# Patient Record
Sex: Female | Born: 1985 | Race: White | Hispanic: No | Marital: Single | Smoking: Never smoker
Health system: Southern US, Community
[De-identification: ages and names within clinical notes are randomized; demographics above are authoritative.]

## PROBLEM LIST (undated history)

## (undated) DIAGNOSIS — E785 Hyperlipidemia, unspecified: Secondary | ICD-10-CM

## (undated) DIAGNOSIS — E119 Type 2 diabetes mellitus without complications: Secondary | ICD-10-CM

## (undated) DIAGNOSIS — Z9889 Other specified postprocedural states: Secondary | ICD-10-CM

## (undated) DIAGNOSIS — H9192 Unspecified hearing loss, left ear: Secondary | ICD-10-CM

## (undated) DIAGNOSIS — K760 Fatty (change of) liver, not elsewhere classified: Secondary | ICD-10-CM

## (undated) DIAGNOSIS — K5792 Diverticulitis of intestine, part unspecified, without perforation or abscess without bleeding: Secondary | ICD-10-CM

## (undated) DIAGNOSIS — G4730 Sleep apnea, unspecified: Secondary | ICD-10-CM

## (undated) DIAGNOSIS — K8020 Calculus of gallbladder without cholecystitis without obstruction: Secondary | ICD-10-CM

## (undated) DIAGNOSIS — Z87442 Personal history of urinary calculi: Secondary | ICD-10-CM

## (undated) HISTORY — DX: Diverticulitis of intestine, part unspecified, without perforation or abscess without bleeding: K57.92

## (undated) HISTORY — DX: Unspecified hearing loss, left ear: H91.92

## (undated) HISTORY — DX: Type 2 diabetes mellitus without complications: E11.9

## (undated) HISTORY — PX: OTHER SURGICAL HISTORY: SHX169

## (undated) HISTORY — DX: Hyperlipidemia, unspecified: E78.5

---

## 2010-01-23 ENCOUNTER — Emergency Department (HOSPITAL_COMMUNITY): Admission: EM | Admit: 2010-01-23 | Discharge: 2010-01-23 | Payer: Self-pay | Admitting: Emergency Medicine

## 2010-05-12 LAB — CBC
HCT: 44.8 % (ref 36.0–46.0)
Hemoglobin: 15.2 g/dL — ABNORMAL HIGH (ref 12.0–15.0)
MCH: 30.4 pg (ref 26.0–34.0)
MCHC: 33.9 g/dL (ref 30.0–36.0)
MCV: 89.6 fL (ref 78.0–100.0)
Platelets: 240 10*3/uL (ref 150–400)
RBC: 5 MIL/uL (ref 3.87–5.11)
RDW: 13.1 % (ref 11.5–15.5)
WBC: 17.7 10*3/uL — ABNORMAL HIGH (ref 4.0–10.5)

## 2010-05-12 LAB — POCT I-STAT, CHEM 8
BUN: 11 mg/dL (ref 6–23)
Calcium, Ion: 1.11 mmol/L — ABNORMAL LOW (ref 1.12–1.32)
Chloride: 109 mEq/L (ref 96–112)
Creatinine, Ser: 0.7 mg/dL (ref 0.4–1.2)
Glucose, Bld: 148 mg/dL — ABNORMAL HIGH (ref 70–99)
HCT: 47 % — ABNORMAL HIGH (ref 36.0–46.0)
Hemoglobin: 16 g/dL — ABNORMAL HIGH (ref 12.0–15.0)
Potassium: 3.9 mEq/L (ref 3.5–5.1)
Sodium: 139 mEq/L (ref 135–145)
TCO2: 21 mmol/L (ref 0–100)

## 2010-05-12 LAB — COMPREHENSIVE METABOLIC PANEL
ALT: 38 U/L — ABNORMAL HIGH (ref 0–35)
AST: 48 U/L — ABNORMAL HIGH (ref 0–37)
Albumin: 3.8 g/dL (ref 3.5–5.2)
Alkaline Phosphatase: 49 U/L (ref 39–117)
BUN: 10 mg/dL (ref 6–23)
CO2: 21 mEq/L (ref 19–32)
Calcium: 9.1 mg/dL (ref 8.4–10.5)
Chloride: 111 mEq/L (ref 96–112)
Creatinine, Ser: 0.69 mg/dL (ref 0.4–1.2)
GFR calc Af Amer: >60 mL/min (ref >60)
GFR calc non Af Amer: >60 mL/min (ref >60)
Glucose, Bld: 152 mg/dL — ABNORMAL HIGH (ref 70–99)
Potassium: 3.6 mEq/L (ref 3.5–5.1)
Sodium: 138 mEq/L (ref 135–145)
Total Bilirubin: 0.3 mg/dL (ref 0.3–1.2)
Total Protein: 6.6 g/dL (ref 6.0–8.3)

## 2010-05-12 LAB — LACTIC ACID, PLASMA: Lactic Acid, Venous: 2.3 mmol/L — ABNORMAL HIGH (ref 0.5–2.2)

## 2010-05-12 LAB — PROTIME-INR
INR: 0.88 (ref 0.00–1.49)
Prothrombin Time: 12.1 seconds (ref 11.6–15.2)

## 2011-10-08 DIAGNOSIS — H7120 Cholesteatoma of mastoid, unspecified ear: Secondary | ICD-10-CM | POA: Insufficient documentation

## 2011-10-08 DIAGNOSIS — H902 Conductive hearing loss, unspecified: Secondary | ICD-10-CM | POA: Insufficient documentation

## 2011-10-08 DIAGNOSIS — G4733 Obstructive sleep apnea (adult) (pediatric): Secondary | ICD-10-CM | POA: Insufficient documentation

## 2011-10-08 DIAGNOSIS — R069 Unspecified abnormalities of breathing: Secondary | ICD-10-CM | POA: Insufficient documentation

## 2012-10-19 ENCOUNTER — Encounter (INDEPENDENT_AMBULATORY_CARE_PROVIDER_SITE_OTHER): Payer: Medicaid Other | Admitting: Obstetrics & Gynecology

## 2012-10-19 ENCOUNTER — Encounter: Payer: Self-pay | Admitting: Obstetrics & Gynecology

## 2012-10-20 ENCOUNTER — Encounter: Payer: Self-pay | Admitting: Obstetrics & Gynecology

## 2012-10-20 NOTE — Progress Notes (Signed)
This encounter was created in error - please disregard.

## 2012-10-24 ENCOUNTER — Other Ambulatory Visit (HOSPITAL_COMMUNITY)
Admission: RE | Admit: 2012-10-24 | Discharge: 2012-10-24 | Disposition: A | Discharge status: Home / Self Care | Payer: Medicaid Other | Source: Ambulatory Visit | Attending: Obstetrics & Gynecology | Admitting: Obstetrics & Gynecology

## 2012-10-24 ENCOUNTER — Other Ambulatory Visit: Payer: Self-pay | Admitting: Obstetrics & Gynecology

## 2012-10-24 ENCOUNTER — Ambulatory Visit (INDEPENDENT_AMBULATORY_CARE_PROVIDER_SITE_OTHER): Payer: Medicaid Other | Admitting: Obstetrics & Gynecology

## 2012-10-24 ENCOUNTER — Encounter: Payer: Self-pay | Admitting: Obstetrics & Gynecology

## 2012-10-24 ENCOUNTER — Other Ambulatory Visit (INDEPENDENT_AMBULATORY_CARE_PROVIDER_SITE_OTHER): Payer: Medicaid Other

## 2012-10-24 VITALS — BP 120/80 | Wt 307.0 lb

## 2012-10-24 DIAGNOSIS — D279 Benign neoplasm of unspecified ovary: Secondary | ICD-10-CM

## 2012-10-24 DIAGNOSIS — Z01419 Encounter for gynecological examination (general) (routine) without abnormal findings: Secondary | ICD-10-CM | POA: Insufficient documentation

## 2012-10-24 DIAGNOSIS — Z113 Encounter for screening for infections with a predominantly sexual mode of transmission: Secondary | ICD-10-CM | POA: Insufficient documentation

## 2012-10-24 DIAGNOSIS — N949 Unspecified condition associated with female genital organs and menstrual cycle: Secondary | ICD-10-CM

## 2012-10-24 DIAGNOSIS — D270 Benign neoplasm of right ovary: Secondary | ICD-10-CM | POA: Insufficient documentation

## 2012-10-24 DIAGNOSIS — N870 Mild cervical dysplasia: Secondary | ICD-10-CM

## 2012-10-24 DIAGNOSIS — N83209 Unspecified ovarian cyst, unspecified side: Secondary | ICD-10-CM

## 2012-10-24 DIAGNOSIS — E282 Polycystic ovarian syndrome: Secondary | ICD-10-CM | POA: Insufficient documentation

## 2012-10-24 NOTE — Progress Notes (Signed)
Patient ID: Tiffany Ward, female   DOB: February 07, 1986, 27 y.o.   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

## 2012-10-24 NOTE — Addendum Note (Signed)
Addended by: Criss Alvine on: 10/24/2012 12:08 PM   Modules accepted: Orders

## 2012-11-08 ENCOUNTER — Telehealth: Payer: Self-pay | Admitting: Adult Health

## 2012-11-08 NOTE — Telephone Encounter (Signed)
Pt aware of normal pap smear.

## 2013-08-07 ENCOUNTER — Ambulatory Visit: Payer: Medicaid Other | Admitting: Obstetrics & Gynecology

## 2013-08-15 ENCOUNTER — Telehealth: Payer: Self-pay | Admitting: Obstetrics and Gynecology

## 2013-08-15 ENCOUNTER — Ambulatory Visit (INDEPENDENT_AMBULATORY_CARE_PROVIDER_SITE_OTHER): Payer: Medicaid Other | Admitting: Obstetrics and Gynecology

## 2013-08-15 ENCOUNTER — Encounter: Payer: Self-pay | Admitting: Obstetrics and Gynecology

## 2013-08-15 VITALS — BP 120/78 | Ht 69.0 in | Wt 293.0 lb

## 2013-08-15 DIAGNOSIS — D270 Benign neoplasm of right ovary: Secondary | ICD-10-CM

## 2013-08-15 DIAGNOSIS — D279 Benign neoplasm of unspecified ovary: Secondary | ICD-10-CM

## 2013-08-15 NOTE — Progress Notes (Signed)
This chart was scribed by Ludger Nutting, Medical Scribe, for Dr. Mallory Shirk on 08/15/13 at 9:36 AM. This chart was reviewed by Dr. Mallory Shirk for accuracy.   [facility] Visit  Patient name: Tiffany Ward MRN [id number]  Date of birth: 05-Oct-1985  CC & HPI:  Tiffany Ward is a 28 y.o. female presenting today for follow up after ED visit at [facility] about 1 month ago. Patient states she had diverticulitis and had a CT scan which showed a cyst pressing on her bladder. She denies any pain unless she is lifting something heavy.   ROS:  Negative  Pertinent History Reviewed:   Reviewed: Significant for  Medical                                   Surgical Hx:   Medications: Reviewed & Updated - see associated section Social History: Reviewed -  reports that she has never smoked. She has never used smokeless tobacco.  Objective Findings:  Vitals: Blood pressure 120/78, height 5\' 9"  (1.753 m), weight 293 lb (132.904 kg).  Physical Examination: Pelvic - Exam normal with variation  VULVA: normal appearing vulva with no masses, tenderness or lesions,  VAGINA: normal appearing vagina with normal color and discharge, no lesions,  CERVIX: normal appearing cervix without discharge or lesions,  UTERUS: uterus is normal size, shape, consistency and nontender, mobile, anterior ADNEXA: normal adnexa in size, nontender and no masses, exam limited by body habitus   See u/s report from 2014 and CT [facility] in 2011. Has a large dermoid  Assessment & Plan:   A: 1. Diverticulitis resolved 2. Alleged ovarian cyst by history  Review of CT from 2011 showed an 8 cm right ovarian dermoid.  P: 1. Review CT results form [place] 2. Follow up in 4 weeks for review of CT results .  Discussed Old ct and u/s by phone after visit: pt choses to follow cyst for now, aware of the rare risk of torsion. Will f/u prn

## 2013-08-24 NOTE — Telephone Encounter (Signed)
CT REVIEWED WITH PT, WHO CHOSES AFTER INFORMED DISCUSSION, TO POSTPONE REMOVAL OF DERMOID AT THIS TIME.

## 2013-09-12 ENCOUNTER — Ambulatory Visit (INDEPENDENT_AMBULATORY_CARE_PROVIDER_SITE_OTHER): Payer: Medicaid Other | Admitting: Obstetrics and Gynecology

## 2013-09-12 ENCOUNTER — Encounter: Payer: Self-pay | Admitting: Obstetrics and Gynecology

## 2013-09-12 VITALS — BP 120/88 | Ht 69.0 in | Wt 285.0 lb

## 2013-09-12 DIAGNOSIS — D270 Benign neoplasm of right ovary: Secondary | ICD-10-CM

## 2013-09-12 DIAGNOSIS — D279 Benign neoplasm of unspecified ovary: Secondary | ICD-10-CM

## 2013-09-12 NOTE — Patient Instructions (Signed)
Ovarian Cyst An ovarian cyst is a fluid-filled sac that forms on an ovary. The ovaries are small organs that produce eggs in women. Various types of cysts can form on the ovaries. Most are not cancerous. Many do not cause problems, and they often go away on their own. Some may cause symptoms and require treatment. Common types of ovarian cysts include:  Functional cysts--These cysts may occur every month during the menstrual cycle. This is normal. The cysts usually go away with the next menstrual cycle if the woman does not get pregnant. Usually, there are no symptoms with a functional cyst.  Endometrioma cysts--These cysts form from the tissue that lines the uterus. They are also called "chocolate cysts" because they become filled with blood that turns brown. This type of cyst can cause pain in the lower abdomen during intercourse and with your menstrual period.  Cystadenoma cysts--This type develops from the cells on the outside of the ovary. These cysts can get very big and cause lower abdomen pain and pain with intercourse. This type of cyst can twist on itself, cut off its blood supply, and cause severe pain. It can also easily rupture and cause a lot of pain.  Dermoid cysts--This type of cyst is sometimes found in both ovaries. These cysts may contain different kinds of body tissue, such as skin, teeth, hair, or cartilage. They usually do not cause symptoms unless they get very big.  Theca lutein cysts--These cysts occur when too much of a certain hormone (human chorionic gonadotropin) is produced and overstimulates the ovaries to produce an egg. This is most common after procedures used to assist with the conception of a baby (in vitro fertilization). CAUSES   Fertility drugs can cause a condition in which multiple large cysts are formed on the ovaries. This is called ovarian hyperstimulation syndrome.  A condition called polycystic ovary syndrome can cause hormonal imbalances that can lead to  nonfunctional ovarian cysts. SIGNS AND SYMPTOMS  Many ovarian cysts do not cause symptoms. If symptoms are present, they may include:  Pelvic pain or pressure.  Pain in the lower abdomen.  Pain during sexual intercourse.  Increasing girth (swelling) of the abdomen.  Abnormal menstrual periods.  Increasing pain with menstrual periods.  Stopping having menstrual periods without being pregnant. DIAGNOSIS  These cysts are commonly found during a routine or annual pelvic exam. Tests may be ordered to find out more about the cyst. These tests may include:  Ultrasound.  X-ray of the pelvis.  CT scan.  MRI.  Blood tests. TREATMENT  Many ovarian cysts go away on their own without treatment. Your health care provider may want to check your cyst regularly for 2-3 months to see if it changes. For women in menopause, it is particularly important to monitor a cyst closely because of the higher rate of ovarian cancer in menopausal women. When treatment is needed, it may include any of the following:  A procedure to drain the cyst (aspiration). This may be done using a long needle and ultrasound. It can also be done through a laparoscopic procedure. This involves using a thin, lighted tube with a tiny camera on the end (laparoscope) inserted through a small incision.  Surgery to remove the whole cyst. This may be done using laparoscopic surgery or an open surgery involving a larger incision in the lower abdomen.  Hormone treatment or birth control pills. These methods are sometimes used to help dissolve a cyst. HOME CARE INSTRUCTIONS   Only take over-the-counter   or prescription medicines as directed by your health care provider.  Follow up with your health care provider as directed.  Get regular pelvic exams and Pap tests. SEEK MEDICAL CARE IF:   Your periods are late, irregular, or painful, or they stop.  Your pelvic pain or abdominal pain does not go away.  Your abdomen becomes  larger or swollen.  You have pressure on your bladder or trouble emptying your bladder completely.  You have pain during sexual intercourse.  You have feelings of fullness, pressure, or discomfort in your stomach.  You lose weight for no apparent reason.  You feel generally ill.  You become constipated.  You lose your appetite.  You develop acne.  You have an increase in body and facial hair.  You are gaining weight, without changing your exercise and eating habits.  You think you are pregnant. SEEK IMMEDIATE MEDICAL CARE IF:   You have increasing abdominal pain.  You feel sick to your stomach (nauseous), and you throw up (vomit).  You develop a fever that comes on suddenly.  You have abdominal pain during a bowel movement.  Your menstrual periods become heavier than usual. MAKE SURE YOU:  Understand these instructions.  Will watch your condition.  Will get help right away if you are not doing well or get worse. Document Released: 02/15/2005 Document Revised: 02/20/2013 Document Reviewed: 10/23/2012 [employer] Patient Information 2015 [employer]. This information is not intended to replace advice given to you by your health care provider. Make sure you discuss any questions you have with your health care provider.  

## 2013-09-12 NOTE — Progress Notes (Signed)
This patient's chart was scribed for Dr. Jonnie Kind by Neta Ehlers, Medical Scribe on 09/12/13 at 9:31 am.   Patient ID: Tiffany Ward, female   DOB: [birth year], 28 y.o.   MRN: [id number]   [name] Visit  Patient name: Tiffany Ward MRN [id number]  Date of birth: 1986-01-31  CC & HPI:  Mack Thurmon is a 28 y.o. female presenting today for f/u. Pt here today for follow up visit. Pt states that she thought Dr.Arnav Cregg was going to do an [place] but there is nothing in his notes about this. Pt does want to discuss taking the cyst out, sooner rather than later.  See ultrasound with dermoid cyst on the right ovary; the cyst measures 10 X 7.2 X 5.9 cm.   Pt' s last pap was October 2014.  She has had one pregnancy; her daughter is 33 years old.  ROS:  Denies abdominal pain.  No other complaints   Pertinent History Reviewed:   Reviewed: Significant for  Medical: Dermoid cyst; PCOS                                    Surgical Hx:   Caesarian section  Medications: Reviewed & Updated - see associated section Social History: Reviewed -  reports that she has never smoked. She has never used smokeless tobacco.  Objective Findings:  Vitals: Blood pressure 120/88, height 5\' 9"  (1.753 m), weight 285 lb (129.275 kg).  Physical Examination: General appearance - alert, well appearing, and in no distress Mental status - alert, oriented to person, place, and time, normal mood, behavior, speech, dress, motor activity, and thought processes Physical Examination: Chest - clear to auscultation, no wheezes, rales or rhonchi, symmetric air entry Physical Examination: Eyes - pupils equal and reactive, extraocular eye movements intact Ears - bilateral TM's and external ear canals normal Nose - normal and patent, no erythema, discharge or polyps Mouth - mucous membranes moist, pharynx normal without lesions Chest - clear to auscultation, no wheezes, rales or rhonchi, symmetric air entry Heart -  normal rate, regular rhythm, normal S1, S2, no murmurs, rubs, clicks or gallops Abdomen - soft, nontender, nondistended, no masses or organomegaly; well-healing surgical scar; obese with large panus  Pelvic - normal external genitalia limited by obesity; VULVA: normal, VAGINA:normal appearing vagina with normal color and discharge, no lesions, CERVIX: normal appearing cervix without discharge or lesions}, UTERUS: well supported , ADNEXA: cannot feel the mass   Chaperone present for pelvic exam.    Assessment & Plan:   A: 1. Dermoid cyst on right ovary   P: 1. Discussed probable laparoscopic surgical removal of dermoid cyst; will attempt to preserve the right ovary and right fallopian tube  2.  Discussed scheduling surgery in August  3. Refer pt to [place]

## 2013-09-20 ENCOUNTER — Encounter (HOSPITAL_COMMUNITY): Payer: Self-pay | Admitting: Pharmacy Technician

## 2013-09-28 ENCOUNTER — Other Ambulatory Visit: Payer: Self-pay | Admitting: Obstetrics and Gynecology

## 2013-09-28 NOTE — H&P (Signed)
  Patient ID: Tiffany Ward, female DOB: Jun 19, 1985, 28 y.o. MRN: [id number]  [facility] Visit   Patient name: Tiffany Ward MRN [id number] Date of birth: Nov 15, 1985  CC & HPI:   Tiffany Ward is a 28 y.o. female presenting today for f/u.  Pt here today for follow up visit. Pt states that she thought Dr.Mikiah Durall was going to do an [place] but there is nothing in his notes about this. Pt does want to discuss taking the cyst out, sooner rather than later.  See ultrasound with dermoid cyst on the right ovary; the cyst measures 10 X 7.2 X 5.9 cm.  Pt' s last pap was October 2014.  She has had one pregnancy; her daughter is 32 years old.  ROS:   Denies abdominal pain.  No other complaints  Pertinent History Reviewed:   Reviewed: Significant for  Medical: Dermoid cyst; PCOS  Surgical Hx: Caesarian section  Medications: Reviewed & Updated - see associated section  Social History: Reviewed - reports that she has never smoked. She has never used smokeless tobacco.  Objective Findings:   Vitals: Blood pressure 120/88, height 5\' 9"  (1.753 m), weight 285 lb (129.275 kg).  Physical Examination: General appearance - alert, well appearing, and in no distress  Mental status - alert, oriented to person, place, and time, normal mood, behavior, speech, dress, motor activity, and thought processes  Physical Examination: Chest - clear to auscultation, no wheezes, rales or rhonchi, symmetric air entry  Physical Examination: Eyes - pupils equal and reactive, extraocular eye movements intact  Ears - bilateral TM's and external ear canals normal  Nose - normal and patent, no erythema, discharge or polyps  Mouth - mucous membranes moist, pharynx normal without lesions  Chest - clear to auscultation, no wheezes, rales or rhonchi, symmetric air entry  Heart - normal rate, regular rhythm, normal S1, S2, no murmurs, rubs, clicks or gallops  Abdomen - soft, nontender, nondistended, no masses or organomegaly;  well-healing surgical scar; obese with large panus  Pelvic - normal external genitalia limited by obesity; VULVA: normal, VAGINA:normal appearing vagina with normal color and discharge, no lesions, CERVIX: normal appearing cervix without discharge or lesions}, UTERUS: well supported , ADNEXA: cannot feel the mass  Chaperone present for pelvic exam.  Assessment & Plan:   A:  1. 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 Discussed probable laparoscopic surgical removal of dermoid cyst; will attempt to preserve the right ovary and right fallopian tube  2. Discussed scheduling surgery in August  3. Refer pt to [place]

## 2013-10-02 NOTE — Patient Instructions (Signed)
Tiffany Ward  10/02/2013   Your procedure is scheduled on:   10/09/2013  Report to [facility] at  23  AM.  Call this number if you have problems the morning of surgery: [phone]   Remember:   Do not eat food or drink liquids after midnight.   Take these medicines the morning of surgery with A SIP OF WATER: none   Do not wear jewelry, make-up or nail polish.  Do not wear lotions, powders, or perfumes.   Do not shave 48 hours prior to surgery. Men may shave face and neck.  Do not bring valuables to the hospital.  [facility] is not responsible  for any belongings or valuables.               Contacts, dentures or bridgework may not be worn into surgery.  Leave suitcase in the car. After surgery it may be brought to your room.  For patients admitted to the hospital, discharge time is determined by your treatment team.               Patients discharged the day of surgery will not be allowed to drive home.  Name and phone number of your driver: family  Special Instructions: Shower using CHG 2 nights before surgery and the night before surgery.  If you shower the day of surgery use CHG.  Use special wash - you have one bottle of CHG for all showers.  You should use approximately 1/3 of the bottle for each shower.   Please read over the following fact sheets that you were given: Pain Booklet, Coughing and Deep Breathing, Surgical Site Infection Prevention, Anesthesia Post-op Instructions and Care and Recovery After Surgery Unilateral Salpingo-Oophorectomy Unilateral salpingo-oophorectomy is the surgical removal of one fallopian tube and ovary. The ovaries are small organs that produce eggs in women. The fallopian tubes transport the egg from the ovary to the womb (uterus). A unilateral salpingo-oophorectomy may be done for various reasons, including:  Infection in the fallopian tube and ovary.  Scar tissue in the fallopian tube and ovary (adhesions).  A cyst or tumor on the  ovary.  A need to remove the fallopian tube and ovary when removing the uterus.  Cancer of the fallopian tube or ovary. The removal of one fallopian tube and ovary will not prevent you from becoming pregnant, put you into menopause, or cause problems with your menstrual periods or sex drive. LET [facility] CARE PROVIDER KNOW ABOUT:  Any allergies you have.  All medicines you are taking, including vitamins, herbs, eye drops, creams, and over-the-counter medicines.  Previous problems you or members of your family have had with the use of anesthetics.  Any blood disorders you have.  Previous surgeries you have had.  Medical conditions you have. RISKS AND COMPLICATIONS  Generally, this is a safe procedure. However, as with any procedure, complications can occur. Possible complications include:  Injury to surrounding organs.  Bleeding.  Infection.  Blood clots in the legs or lungs.  Problems related to anesthesia. BEFORE THE PROCEDURE  Ask your health care provider about changing or stopping your regular medicines. You may need to stop taking certain medicines, such as aspirin or blood thinners, at least 1 week before the surgery.  Do not eat or drink anything for at least 8 hours before the surgery.  If you smoke, do not smoke for at least 2 weeks before the surgery.  Make plans to have someone  drive you home after the procedure or after your hospital stay. Also arrange for someone to help you with activities during recovery. PROCEDURE  You will be given medicine to help you relax before the procedure (sedative). You will then be given medicine to make you sleep through the procedure (general anesthetic). These medicines will be given through an IV access tube that is put into one of your veins.  Once you are asleep, your lower abdomen will be shaved and cleaned. A thin, flexible tube (catheter) will be placed in your bladder.  The surgeon may use a laparoscopic, robotic,  or open technique for this surgery:  In the laparoscopic technique, the surgery is done through two small cuts (incisions) in the abdomen. A thin, lighted tube with a tiny camera on the end (laparoscope) is inserted into one of the incisions. The tools needed for the procedure are put through the other incision.  A robotic technique may be chosen to perform complex surgery in a small space. In the robotic technique, small incisions are made. A camera and surgical instruments are passed through the incisions. Surgical instruments are controlled with the help of a robotic arm.  In the open technique, the surgery is done through one large incision in the abdomen.  Using any of these techniques, the surgeon will remove the fallopian tube and ovary. The blood vessels will be clamped and tied.  The surgeon will then use staples or stitches to close the incision or incisions. AFTER THE PROCEDURE  You will be taken to a recovery area where your progress will be monitored for 1-3 hours. Your blood pressure, pulse, and temperature will be checked often. You will remain in the recovery area until you are stable and waking up.  If the laparoscopic technique was used, you may be allowed to go home after several hours. You may have some shoulder pain. This is normal and usually goes away in a day or two.  If the open technique was used, you will be admitted to the hospital for a couple of days.  You will be given pain medicine as necessary.  The IV tube and catheter will be removed before you are discharged. Document Released: 12/13/2008 Document Revised: 02/20/2013 Document Reviewed: 08/09/2012 [facility] Patient Information 2015 [place], [place]. This information is not intended to replace advice given to you by your health care provider. Make sure you discuss any questions you have with your health care provider. Ovarian Cystectomy Ovarian cystectomy is surgery to remove a fluid-filled sac (cyst) on an  ovary. The ovaries are small organs that produce eggs in women. Various types of cysts can form on the ovaries. Most are not cancerous. Surgery may be done if a cyst is large or is causing symptoms such as pain. It may also be done for a cyst that is or might be cancerous. This surgery can be done using a laparoscopic technique or an open abdominal technique. The laparoscopic technique involves smaller cuts (incisions) and a faster recovery time. The technique used will depend on your age, the type of cyst, and whether the cyst is cancerous. The laparoscopic technique is not used for a cancerous cyst. LET [facility] CARE PROVIDER KNOW ABOUT:   Any allergies you have.  All medicines you are taking, including vitamins, herbs, eye drops, creams, and over-the-counter medicines.  Previous problems you or members of your family have had with the use of anesthetics.  Any blood disorders you have.  Previous surgeries you have had.  Medical conditions you have.  Any chance you might be pregnant. RISKS AND COMPLICATIONS Generally, this is a safe procedure. However, as with any procedure, complications can occur. Possible complications include:  Excessive bleeding.  Infection.  Injury to other organs.  Blood clots.  Becoming incapable of getting pregnant (infertile). BEFORE THE PROCEDURE  Ask your health care provider about changing or stopping any regular medicines. Avoid taking aspirin, ibuprofen, or blood thinners as directed by your health care provider.  Do not eat or drink anything after midnight the night before surgery.  If you smoke, do not smoke for at least 2 weeks before your surgery.  Do not drink alcohol the day before your surgery.  Let your health care provider know if you develop a cold or any infection before your surgery.  Arrange for someone to drive you home after the procedure or after your hospital stay. Also arrange for someone to help you with activities during  recovery. PROCEDURE  Either a laparoscopic technique or an open abdominal technique may be used for this surgery.  Small monitors will be put on your body. They are used to check your heart, blood pressure, and oxygen level.   An IV access tube will be put into one of your veins. Medicine will be able to flow directly into your body through this IV tube.   You might be given a medicine to help you relax (sedative).   You will be given a medicine to make you sleep (general anesthetic). A breathing tube may be placed into your lungs during the procedure. Laparoscopic Technique  Several small cuts (incisions) are made in your abdomen. These are typically about 1 to 2 cm long.   Your abdomen will be filled with carbon dioxide gas so that it expands. This gives the surgeon more room to operate and makes your organs easier to see.   A thin, lighted tube with a tiny camera on the end (laparoscope) is put through one of the small incisions. The camera on the laparoscope sends a picture to a TV screen in the operating room. This gives the surgeon a good view inside your abdomen.   Hollow tubes are put through the other small incisions in your abdomen. The tools needed for the procedure are put through these tubes.  The ovary with the cyst is identified, and the cyst is removed. It is sent to the lab for testing. If it is cancer, both ovaries may need to be removed during a different surgery.  Tools are removed. The incisions are then closed with stitches or skin glue, and dressings may be applied. Open Abdominal Technique  A single large incision is made along your bikini line or in the middle of your lower abdomen.  The ovary with the cyst is identified, and the cyst is removed. It is sent to the lab for testing. If it is cancer, both ovaries may need to be removed during a different surgery.  The incision is then closed with stitches or staples. AFTER THE PROCEDURE   You will wake up  from anesthesia and be taken to a recovery area.  If you had laparoscopic surgery, you may be able to go home the same day, or you may need to stay in the hospital overnight.  If you had open abdominal surgery, you will need to stay in the hospital for a few days.  Your IV access tube and catheter will be removed the first or second day, after you are able  to eat and drink enough.  You may be given medicine to relieve pain or to help you sleep.  You may be given an antibiotic medicine if needed. Document Released: 12/13/2006 Document Revised: 12/06/2012 Document Reviewed: 09/27/2012 [facility] Patient Information 2015 [place], [place]. This information is not intended to replace advice given to you by your health care provider. Make sure you discuss any questions you have with your health care provider. PATIENT INSTRUCTIONS POST-ANESTHESIA  IMMEDIATELY FOLLOWING SURGERY:  Do not drive or operate machinery for the first twenty four hours after surgery.  Do not make any important decisions for twenty four hours after surgery or while taking narcotic pain medications or sedatives.  If you develop intractable nausea and vomiting or a severe headache please notify your doctor immediately.  FOLLOW-UP:  Please make an appointment with your surgeon as instructed. You do not need to follow up with anesthesia unless specifically instructed to do so.  WOUND CARE INSTRUCTIONS (if applicable):  Keep a dry clean dressing on the anesthesia/puncture wound site if there is drainage.  Once the wound has quit draining you may leave it open to air.  Generally you should leave the bandage intact for twenty four hours unless there is drainage.  If the epidural site drains for more than 36-48 hours please call the anesthesia department.  QUESTIONS?:  Please feel free to call your physician or the hospital operator if you have any questions, and they will be happy to assist you.

## 2013-10-03 ENCOUNTER — Encounter (HOSPITAL_COMMUNITY): Payer: Self-pay

## 2013-10-03 ENCOUNTER — Encounter (HOSPITAL_COMMUNITY)
Admission: RE | Admit: 2013-10-03 | Discharge: 2013-10-03 | Disposition: A | Discharge status: Home / Self Care | Payer: Medicaid Other | Source: Ambulatory Visit | Attending: Obstetrics and Gynecology | Admitting: Obstetrics and Gynecology

## 2013-10-03 DIAGNOSIS — Z01818 Encounter for other preprocedural examination: Secondary | ICD-10-CM | POA: Insufficient documentation

## 2013-10-03 DIAGNOSIS — Z01812 Encounter for preprocedural laboratory examination: Secondary | ICD-10-CM | POA: Insufficient documentation

## 2013-10-03 HISTORY — DX: Sleep apnea, unspecified: G47.30

## 2013-10-03 LAB — URINE MICROSCOPIC-ADD ON

## 2013-10-03 LAB — COMPREHENSIVE METABOLIC PANEL
ALT: 23 U/L (ref 0–35)
AST: 20 U/L (ref 0–37)
Albumin: 3.7 g/dL (ref 3.5–5.2)
Alkaline Phosphatase: 55 U/L (ref 39–117)
Anion gap: 10 (ref 5–15)
BUN: 12 mg/dL (ref 6–23)
CO2: 26 mEq/L (ref 19–32)
Calcium: 9.5 mg/dL (ref 8.4–10.5)
Chloride: 105 mEq/L (ref 96–112)
Creatinine, Ser: 0.73 mg/dL (ref 0.50–1.10)
GFR calc Af Amer: >90 mL/min (ref >90)
GFR calc non Af Amer: >90 mL/min (ref >90)
Glucose, Bld: 93 mg/dL (ref 70–99)
Potassium: 4.2 mEq/L (ref 3.7–5.3)
Sodium: 141 mEq/L (ref 137–147)
Total Bilirubin: 0.2 mg/dL — ABNORMAL LOW (ref 0.3–1.2)
Total Protein: 6.9 g/dL (ref 6.0–8.3)

## 2013-10-03 LAB — CBC
HCT: 42.3 % (ref 36.0–46.0)
Hemoglobin: 14.2 g/dL (ref 12.0–15.0)
MCH: 29.7 pg (ref 26.0–34.0)
MCHC: 33.6 g/dL (ref 30.0–36.0)
MCV: 88.5 fL (ref 78.0–100.0)
Platelets: 243 10*3/uL (ref 150–400)
RBC: 4.78 MIL/uL (ref 3.87–5.11)
RDW: 13.2 % (ref 11.5–15.5)
WBC: 8.5 10*3/uL (ref 4.0–10.5)

## 2013-10-03 LAB — URINALYSIS, ROUTINE W REFLEX MICROSCOPIC
Bilirubin Urine: NEGATIVE
Glucose, UA: NEGATIVE mg/dL
Leukocytes, UA: NEGATIVE
Nitrite: NEGATIVE
Specific Gravity, Urine: >1.03 — ABNORMAL HIGH (ref 1.005–1.030)
Urobilinogen, UA: 0.2 mg/dL (ref 0.0–1.0)
pH: 5.5 (ref 5.0–8.0)

## 2013-10-03 LAB — HCG, SERUM, QUALITATIVE: Preg, Serum: NEGATIVE

## 2013-10-03 NOTE — Pre-Procedure Instructions (Signed)
Patient given information to sign up for my chart at home. 

## 2013-10-09 ENCOUNTER — Ambulatory Visit (HOSPITAL_COMMUNITY)
Admission: RE | Admit: 2013-10-09 | Discharge: 2013-10-09 | Disposition: A | Discharge status: Home / Self Care | Payer: Medicaid Other | Source: Ambulatory Visit | Attending: Obstetrics and Gynecology | Admitting: Obstetrics and Gynecology

## 2013-10-09 ENCOUNTER — Ambulatory Visit (HOSPITAL_COMMUNITY): Payer: Medicaid Other | Admitting: Anesthesiology

## 2013-10-09 ENCOUNTER — Encounter (HOSPITAL_COMMUNITY)
Admission: RE | Disposition: A | Discharge status: Home / Self Care | Payer: Self-pay | Source: Ambulatory Visit | Attending: Obstetrics and Gynecology

## 2013-10-09 ENCOUNTER — Encounter (HOSPITAL_COMMUNITY): Payer: Medicaid Other | Admitting: Anesthesiology

## 2013-10-09 DIAGNOSIS — N8300 Follicular cyst of ovary, unspecified side: Secondary | ICD-10-CM

## 2013-10-09 DIAGNOSIS — D270 Benign neoplasm of right ovary: Secondary | ICD-10-CM | POA: Diagnosis present

## 2013-10-09 DIAGNOSIS — D279 Benign neoplasm of unspecified ovary: Secondary | ICD-10-CM | POA: Diagnosis not present

## 2013-10-09 DIAGNOSIS — E282 Polycystic ovarian syndrome: Secondary | ICD-10-CM | POA: Diagnosis not present

## 2013-10-09 HISTORY — PX: LAPAROSCOPIC SALPINGO OOPHERECTOMY: SHX5927

## 2013-10-09 LAB — GLUCOSE, CAPILLARY
Glucose-Capillary: 87 mg/dL (ref 70–99)
Glucose-Capillary: 103 mg/dL — ABNORMAL HIGH (ref 70–99)

## 2013-10-09 SURGERY — SALPINGO-OOPHORECTOMY, LAPAROSCOPIC
Anesthesia: General | Laterality: Right

## 2013-10-09 MED ORDER — NEOSTIGMINE METHYLSULFATE 10 MG/10ML IV SOLN
INTRAVENOUS | Status: DC | PRN
Start: 2013-10-09 — End: 2013-10-09
  Administered 2013-10-09: 1 mg via INTRAVENOUS
  Administered 2013-10-09: 2 mg via INTRAVENOUS
  Administered 2013-10-09: 1 mg via INTRAVENOUS

## 2013-10-09 MED ORDER — FENTANYL CITRATE 0.05 MG/ML IJ SOLN
INTRAMUSCULAR | Status: AC
  Filled 2013-10-09: qty 5

## 2013-10-09 MED ORDER — KETOROLAC TROMETHAMINE 30 MG/ML IJ SOLN
30.0000 mg | Freq: Once | INTRAMUSCULAR | Status: AC
  Administered 2013-10-09: 30 mg via INTRAVENOUS

## 2013-10-09 MED ORDER — MIDAZOLAM HCL 2 MG/2ML IJ SOLN
INTRAMUSCULAR | Status: AC
  Filled 2013-10-09: qty 2

## 2013-10-09 MED ORDER — ROCURONIUM BROMIDE 50 MG/5ML IV SOLN
INTRAVENOUS | Status: AC
  Filled 2013-10-09: qty 1

## 2013-10-09 MED ORDER — LACTATED RINGERS IV SOLN
INTRAVENOUS | Status: DC
  Administered 2013-10-09: 12:00:00 via INTRAVENOUS
  Administered 2013-10-09: 1000 mL via INTRAVENOUS

## 2013-10-09 MED ORDER — DEXAMETHASONE SODIUM PHOSPHATE 4 MG/ML IJ SOLN
4.0000 mg | Freq: Once | INTRAMUSCULAR | Status: AC
  Administered 2013-10-09: 4 mg via INTRAVENOUS

## 2013-10-09 MED ORDER — PROPOFOL 10 MG/ML IV BOLUS
INTRAVENOUS | Status: AC
  Filled 2013-10-09: qty 20

## 2013-10-09 MED ORDER — ONDANSETRON HCL 4 MG/2ML IJ SOLN
4.0000 mg | Freq: Once | INTRAMUSCULAR | Status: AC
  Administered 2013-10-09: 4 mg via INTRAVENOUS

## 2013-10-09 MED ORDER — ONDANSETRON HCL 4 MG/2ML IJ SOLN
4.0000 mg | Freq: Once | INTRAMUSCULAR | Status: AC | PRN
  Administered 2013-10-09: 4 mg via INTRAVENOUS

## 2013-10-09 MED ORDER — GLYCOPYRROLATE 0.2 MG/ML IJ SOLN
0.2000 mg | Freq: Once | INTRAMUSCULAR | Status: AC
  Administered 2013-10-09: 0.2 mg via INTRAVENOUS

## 2013-10-09 MED ORDER — FENTANYL CITRATE 0.05 MG/ML IJ SOLN
INTRAMUSCULAR | Status: DC | PRN
  Administered 2013-10-09 (×5): 50 ug via INTRAVENOUS
  Administered 2013-10-09: 100 ug via INTRAVENOUS
  Administered 2013-10-09: 50 ug via INTRAVENOUS

## 2013-10-09 MED ORDER — LIDOCAINE HCL (PF) 1 % IJ SOLN
INTRAMUSCULAR | Status: AC
  Filled 2013-10-09: qty 5

## 2013-10-09 MED ORDER — BUPIVACAINE HCL (PF) 0.5 % IJ SOLN
INTRAMUSCULAR | Status: DC | PRN
  Administered 2013-10-09: 10 mL

## 2013-10-09 MED ORDER — LIDOCAINE HCL 1 % IJ SOLN
INTRAMUSCULAR | Status: DC | PRN
  Administered 2013-10-09: 50 mg via INTRADERMAL

## 2013-10-09 MED ORDER — GLYCOPYRROLATE 0.2 MG/ML IJ SOLN
INTRAMUSCULAR | Status: DC | PRN
  Administered 2013-10-09: 0.4 mg via INTRAVENOUS

## 2013-10-09 MED ORDER — PROPOFOL 10 MG/ML IV BOLUS
INTRAVENOUS | Status: DC | PRN
  Administered 2013-10-09: 160 mg via INTRAVENOUS

## 2013-10-09 MED ORDER — VASOPRESSIN 20 UNIT/ML IJ SOLN
INTRAMUSCULAR | Status: AC
  Filled 2013-10-09: qty 1

## 2013-10-09 MED ORDER — ONDANSETRON HCL 4 MG/2ML IJ SOLN
INTRAMUSCULAR | Status: AC
  Filled 2013-10-09: qty 2

## 2013-10-09 MED ORDER — KETOROLAC TROMETHAMINE 10 MG PO TABS: 10.0000 mg | ORAL_TABLET | Freq: Four times a day (QID) | ORAL | Status: DC | PRN

## 2013-10-09 MED ORDER — 0.9 % SODIUM CHLORIDE (POUR BTL) OPTIME
TOPICAL | Status: DC | PRN
Start: 2013-10-09 — End: 2013-10-09
  Administered 2013-10-09: 1000 mL

## 2013-10-09 MED ORDER — KETOROLAC TROMETHAMINE 30 MG/ML IJ SOLN
INTRAMUSCULAR | Status: AC
  Filled 2013-10-09: qty 1

## 2013-10-09 MED ORDER — FENTANYL CITRATE 0.05 MG/ML IJ SOLN
25.0000 ug | INTRAMUSCULAR | Status: DC | PRN
  Administered 2013-10-09 (×2): 50 ug via INTRAVENOUS

## 2013-10-09 MED ORDER — FENTANYL CITRATE 0.05 MG/ML IJ SOLN
INTRAMUSCULAR | Status: AC
  Filled 2013-10-09: qty 2

## 2013-10-09 MED ORDER — MIDAZOLAM HCL 2 MG/2ML IJ SOLN
1.0000 mg | INTRAMUSCULAR | Status: DC | PRN
  Administered 2013-10-09 (×2): 2 mg via INTRAVENOUS
  Filled 2013-10-09: qty 2

## 2013-10-09 MED ORDER — SUCCINYLCHOLINE CHLORIDE 20 MG/ML IJ SOLN
INTRAMUSCULAR | Status: DC | PRN
  Administered 2013-10-09: 175 mg via INTRAVENOUS

## 2013-10-09 MED ORDER — BUPIVACAINE HCL (PF) 0.5 % IJ SOLN
INTRAMUSCULAR | Status: AC
  Filled 2013-10-09: qty 30

## 2013-10-09 MED ORDER — DEXAMETHASONE SODIUM PHOSPHATE 4 MG/ML IJ SOLN
INTRAMUSCULAR | Status: AC
  Filled 2013-10-09: qty 1

## 2013-10-09 MED ORDER — GLYCOPYRROLATE 0.2 MG/ML IJ SOLN
INTRAMUSCULAR | Status: AC
  Filled 2013-10-09: qty 2

## 2013-10-09 MED ORDER — ROCURONIUM BROMIDE 100 MG/10ML IV SOLN
INTRAVENOUS | Status: DC | PRN
  Administered 2013-10-09: 10 mg via INTRAVENOUS
  Administered 2013-10-09: 40 mg via INTRAVENOUS
  Administered 2013-10-09 (×2): 10 mg via INTRAVENOUS

## 2013-10-09 MED ORDER — MIDAZOLAM HCL 5 MG/5ML IJ SOLN
INTRAMUSCULAR | Status: DC | PRN
  Administered 2013-10-09: 2 mg via INTRAVENOUS

## 2013-10-09 MED ORDER — OXYCODONE-ACETAMINOPHEN 5-325 MG PO TABS: 1.0000 | ORAL_TABLET | ORAL | Status: DC | PRN

## 2013-10-09 MED ORDER — KETOROLAC TROMETHAMINE 30 MG/ML IM SOLN: 30.0000 mg | Freq: Once | INTRAMUSCULAR | Status: DC

## 2013-10-09 MED ORDER — GLYCOPYRROLATE 0.2 MG/ML IJ SOLN
INTRAMUSCULAR | Status: AC
  Filled 2013-10-09: qty 1

## 2013-10-09 MED ORDER — SUCCINYLCHOLINE CHLORIDE 20 MG/ML IJ SOLN
INTRAMUSCULAR | Status: AC
  Filled 2013-10-09: qty 1

## 2013-10-09 SURGICAL SUPPLY — 54 items
BAG HAMPER (MISCELLANEOUS) ×3 IMPLANT
BANDAGE STRIP 1X3 FLEXIBLE (GAUZE/BANDAGES/DRESSINGS) ×12 IMPLANT
BLADE SURG SZ11 CARB STEEL (BLADE) ×3 IMPLANT
CLOSURE WOUND 1/4 X3 (GAUZE/BANDAGES/DRESSINGS) ×1
CLOTH BEACON ORANGE TIMEOUT ST (SAFETY) ×3 IMPLANT
COVER LIGHT HANDLE STERIS (MISCELLANEOUS) ×6 IMPLANT
DRSG TEGADERM 2-3/8X2-3/4 SM (GAUZE/BANDAGES/DRESSINGS) ×9 IMPLANT
DRSG TEGADERM 4X4.75 (GAUZE/BANDAGES/DRESSINGS) ×3 IMPLANT
DURAPREP 26ML APPLICATOR (WOUND CARE) ×3 IMPLANT
ELECT REM PT RETURN 9FT ADLT (ELECTROSURGICAL) ×3
ELECTRODE REM PT RTRN 9FT ADLT (ELECTROSURGICAL) ×1 IMPLANT
FILTER SMOKE EVAC LAPAROSHD (FILTER) ×3 IMPLANT
FORMALIN 10 PREFIL 480ML (MISCELLANEOUS) ×3 IMPLANT
GAUZE SPONGE 4X4 16PLY XRAY LF (GAUZE/BANDAGES/DRESSINGS) ×3 IMPLANT
GLOVE BIO SURGEON STRL SZ7 (GLOVE) ×9 IMPLANT
GLOVE BIOGEL PI IND STRL 7.5 (GLOVE) ×1 IMPLANT
GLOVE BIOGEL PI IND STRL 9 (GLOVE) ×2 IMPLANT
GLOVE BIOGEL PI INDICATOR 7.5 (GLOVE) ×2
GLOVE BIOGEL PI INDICATOR 9 (GLOVE) ×4
GLOVE ECLIPSE 6.5 STRL STRAW (GLOVE) ×3 IMPLANT
GLOVE ECLIPSE 9.0 STRL (GLOVE) ×3 IMPLANT
GLOVE SURG SS PI 7.5 STRL IVOR (GLOVE) ×3 IMPLANT
GOWN SPEC L3 XXLG W/TWL (GOWN DISPOSABLE) ×3 IMPLANT
GOWN STRL REUS W/TWL LRG LVL3 (GOWN DISPOSABLE) ×9 IMPLANT
INST SET LAPROSCOPIC GYN AP (KITS) ×3 IMPLANT
KIT ROOM TURNOVER AP CYSTO (KITS) ×3 IMPLANT
KIT ROOM TURNOVER APOR (KITS) ×3 IMPLANT
KIT TROCAR LAP GYN (TROCAR) ×3 IMPLANT
MANIFOLD NEPTUNE II (INSTRUMENTS) ×3 IMPLANT
NEEDLE HYPO 25X1 1.5 SAFETY (NEEDLE) ×3 IMPLANT
NEEDLE INSUFFLATION 120MM (ENDOMECHANICALS) ×3 IMPLANT
NS IRRIG 1000ML POUR BTL (IV SOLUTION) ×3 IMPLANT
PACK PERI GYN (CUSTOM PROCEDURE TRAY) ×3 IMPLANT
PAD ARMBOARD 7.5X6 YLW CONV (MISCELLANEOUS) ×3 IMPLANT
POUCH SPECIMEN RETRIEVAL 10MM (ENDOMECHANICALS) ×3 IMPLANT
SCALPEL HARMONIC ACE (MISCELLANEOUS) ×3 IMPLANT
SET BASIN LINEN APH (SET/KITS/TRAYS/PACK) ×3 IMPLANT
SLEEVE ENDOPATH XCEL 5M (ENDOMECHANICALS) ×3 IMPLANT
SOLUTION ANTI FOG 6CC (MISCELLANEOUS) ×3 IMPLANT
STRIP CLOSURE SKIN 1/4X3 (GAUZE/BANDAGES/DRESSINGS) ×2 IMPLANT
SUT VIC AB 4-0 PS2 27 (SUTURE) ×6 IMPLANT
SUT VICRYL 0 UR6 27IN ABS (SUTURE) ×6 IMPLANT
SYR BULB IRRIGATION 50ML (SYRINGE) ×3 IMPLANT
SYRINGE 12CC LL (MISCELLANEOUS) ×3 IMPLANT
SYRINGE 60CC LL (MISCELLANEOUS) ×3 IMPLANT
SYRINGE CONTROL L 12CC (SYRINGE) ×3 IMPLANT
TOWEL OR 17X26 4PK STRL BLUE (TOWEL DISPOSABLE) ×3 IMPLANT
TRAY FOLEY CATH 16FR SILVER (SET/KITS/TRAYS/PACK) ×3 IMPLANT
TROCAR ENDO BLADELESS 11MM (ENDOMECHANICALS) ×3 IMPLANT
TROCAR XCEL NON-BLD 5MMX100MML (ENDOMECHANICALS) ×3 IMPLANT
TROCAR Z-THAD FIOS HNDL 12X100 (TROCAR) ×3 IMPLANT
TUBING INSUFFLATION (TUBING) ×3 IMPLANT
TUBING INSUFFLATION 10FT LAP (TUBING) ×3 IMPLANT
WARMER LAPAROSCOPE (MISCELLANEOUS) ×3 IMPLANT

## 2013-10-09 NOTE — Anesthesia Preprocedure Evaluation (Signed)
Anesthesia Evaluation  Patient identified by MRN, date of birth, ID band Patient awake    Reviewed: Allergy & Precautions, H&P , NPO status , Patient's Chart, lab work & pertinent test results  Airway Mallampati: II TM Distance: >3 FB     Dental  (+) Teeth Intact   Pulmonary sleep apnea ,  breath sounds clear to auscultation        Cardiovascular negative cardio ROS  Rhythm:Regular Rate:Normal     Neuro/Psych    GI/Hepatic negative GI ROS,   Endo/Other  diabetes (no meds), Type 2Morbid obesity  Renal/GU      Musculoskeletal   Abdominal   Peds  Hematology   Anesthesia Other Findings   Reproductive/Obstetrics                           Anesthesia Physical Anesthesia Plan  ASA: II  Anesthesia Plan: General   Post-op Pain Management:    Induction: Intravenous, Rapid sequence and Cricoid pressure planned  Airway Management Planned: Oral ETT  Additional Equipment:   Intra-op Plan:   Post-operative Plan: Extubation in OR  Informed Consent: I have reviewed the patients History and Physical, chart, labs and discussed the procedure including the risks, benefits and alternatives for the proposed anesthesia with the patient or authorized representative who has indicated his/her understanding and acceptance.     Plan Discussed with:   Anesthesia Plan Comments:         Anesthesia Quick Evaluation

## 2013-10-09 NOTE — Transfer of Care (Signed)
Immediate Anesthesia Transfer of Care Note  Patient: Tiffany Ward  Procedure(s) Performed: Procedure(s): LAPAROSCOPIC RIGHT OOPHORECTOMY (Right)  Patient Location: PACU  Anesthesia Type:General  Level of Consciousness: awake and patient cooperative  Airway & Oxygen Therapy: Patient Spontanous Breathing and Patient connected to face mask oxygen  Post-op Assessment: Report given to PACU RN, Post -op Vital signs reviewed and stable and Patient moving all extremities  Post vital signs: Reviewed and stable  Complications: No apparent anesthesia complications

## 2013-10-09 NOTE — Anesthesia Procedure Notes (Signed)
Procedure Name: Intubation Date/Time: 10/09/2013 11:35 AM Performed by: Charmaine Downs Pre-anesthesia Checklist: Emergency Drugs available, Patient identified, Suction available and Patient being monitored Patient Re-evaluated:Patient Re-evaluated prior to inductionOxygen Delivery Method: Circle system utilized Preoxygenation: Pre-oxygenation with 100% oxygen Intubation Type: IV induction, Rapid sequence and Cricoid Pressure applied Ventilation: Mask ventilation without difficulty Laryngoscope Size: Mac and 3 Grade View: Grade I Tube type: Oral Tube size: 7.0 mm Number of attempts: 1 Airway Equipment and Method: Stylet Placement Confirmation: positive ETCO2,  ETT inserted through vocal cords under direct vision and breath sounds checked- equal and bilateral Secured at: 22 cm Tube secured with: Tape Dental Injury: Teeth and Oropharynx as per pre-operative assessment

## 2013-10-09 NOTE — Discharge Instructions (Signed)
You we'll be out of work for 2 weeks, and will be seen in the office prior to returning to work    Diagnostic Laparoscopy Laparoscopy is a surgical procedure. It is used to diagnose and treat diseases inside the belly (abdomen). It is usually a brief, common, and relatively simple procedure. The laparoscopeis a thin, lighted, pencil-sized instrument. It is like a telescope. It is inserted into your abdomen through a small cut (incision). Your caregiver can look at the organs inside your body through this instrument. He or she can see if there is anything abnormal. Laparoscopy can be done either in a hospital or outpatient clinic. You may be given a mild sedative to help you relax before the procedure. Once in the operating room, you will be given a drug to make you sleep (general anesthesia). Laparoscopy usually lasts less than 1 hour. After the procedure, you will be monitored in a recovery area until you are stable and doing well. Once you are home, it will take 2 to 3 days to fully recover. RISKS AND COMPLICATIONS  Laparoscopy has relatively few risks. Your caregiver will discuss the risks with you before the procedure. Some problems that can occur include:  Infection.  Bleeding.  Damage to other organs.  Anesthetic side effects. PROCEDURE Once you receive anesthesia, your surgeon inflates the abdomen with a harmless gas (carbon dioxide). This makes the organs easier to see. The laparoscope is inserted into the abdomen through a small incision. This allows your surgeon to see into the abdomen. Other small instruments are also inserted into the abdomen through other small openings. Many surgeons attach a video camera to the laparoscope to enlarge the view. During a diagnostic laparoscopy, the surgeon may be looking for inflammation, infection, or cancer. Your surgeon may take tissue samples(biopsies). The samples are sent to a specialist in looking at cells and tissue samples (pathologist).  The pathologist examines them under a microscope. Biopsies can help to diagnose or confirm a disease. AFTER THE PROCEDURE   The gas is released from inside the abdomen.  The incisions are closed with stitches (sutures). Because these incisions are small (usually less than 1/2 inch), there is usually minimal discomfort after the procedure. There may be some mild discomfort in the throat. This is from the tube placed in the throat while you were sleeping. You may have some mild abdominal discomfort. There may also be discomfort from the instrument placement incisions in the abdomen.  The recovery time is shortened as long as there are no complications.  You will rest in a recovery room until stable and doing well. As long as there are no complications, you may be allowed to go home. FINDING OUT THE RESULTS OF YOUR TEST Not all test results are available during your visit. If your test results are not back during the visit, make an appointment with your caregiver to find out the results. Do not assume everything is normal if you have not heard from your caregiver or the medical facility. It is important for you to follow up on all of your test results. HOME CARE INSTRUCTIONS   Take all medicines as directed.  Only take over-the-counter or prescription medicines for pain, discomfort, or fever as directed by your caregiver.  Resume daily activities as directed.  Showers are preferred over baths.  You may resume sexual activities in 1 week or as directed.  Do not drive while taking narcotics. SEEK MEDICAL CARE IF:   There is increasing abdominal pain.  There is new pain in the shoulders (shoulder strap areas).  You feel lightheaded or faint.  You have the chills.  You or your child has an oral temperature above 102 F (38.9 C).  There is pus-like (purulent) drainage from any of the wounds.  You are unable to pass gas or have a bowel movement.  You feel sick to your stomach  (nauseous) or throw up (vomit). MAKE SURE YOU:   Understand these instructions.  Will watch your condition.  Will get help right away if you are not doing well or get worse. Document Released: 05/24/2000 Document Revised: 06/12/2012 Document Reviewed: 02/15/2007 [facility] Patient Information 2015 [place], [place]. This information is not intended to replace advice given to you by your health care provider. Make sure you discuss any questions you have with your health care provider.

## 2013-10-09 NOTE — Op Note (Signed)
C. the brief operative note for details

## 2013-10-09 NOTE — H&P (View-Only) (Signed)
  Patient ID: Tiffany Ward, female DOB: 1985-12-21, 29 y.o. MRN: [id number]  [facility] Visit   Patient name: Tiffany Ward MRN [id number] Date of birth: 07-07-1985  CC & HPI:   [name] is a 28 y.o. female presenting today for f/u.  Pt here today for follow up visit. Pt states that she thought Dr.Jelani Trueba was going to do an [place] but there is nothing in his notes about this. Pt does want to discuss taking the cyst out, sooner rather than later.  See ultrasound with dermoid cyst on the right ovary; the cyst measures 10 X 7.2 X 5.9 cm.  Pt' s last pap was October 2014.  She has had one pregnancy; her daughter is 50 years old.  ROS:   Denies abdominal pain.  No other complaints  Pertinent History Reviewed:   Reviewed: Significant for  Medical: Dermoid cyst; PCOS  Surgical Hx: Caesarian section  Medications: Reviewed & Updated - see associated section  Social History: Reviewed - reports that she has never smoked. She has never used smokeless tobacco.  Objective Findings:   Vitals: Blood pressure 120/88, height 5\' 9"  (1.753 m), weight 285 lb (129.275 kg).  Physical Examination: General appearance - alert, well appearing, and in no distress  Mental status - alert, oriented to person, place, and time, normal mood, behavior, speech, dress, motor activity, and thought processes  Physical Examination: Chest - clear to auscultation, no wheezes, rales or rhonchi, symmetric air entry  Physical Examination: Eyes - pupils equal and reactive, extraocular eye movements intact  Ears - bilateral TM's and external ear canals normal  Nose - normal and patent, no erythema, discharge or polyps  Mouth - mucous membranes moist, pharynx normal without lesions  Chest - clear to auscultation, no wheezes, rales or rhonchi, symmetric air entry  Heart - normal rate, regular rhythm, normal S1, S2, no murmurs, rubs, clicks or gallops  Abdomen - soft, nontender, nondistended, no masses or organomegaly;  well-healing surgical scar; obese with large panus  Pelvic - normal external genitalia limited by obesity; VULVA: normal, VAGINA:normal appearing vagina with normal color and discharge, no lesions, CERVIX: normal appearing cervix without discharge or lesions}, UTERUS: well supported , ADNEXA: cannot feel the mass  Chaperone present for pelvic exam.  Assessment & Plan:   A:  1. 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 Discussed probable laparoscopic surgical removal of dermoid cyst; will attempt to preserve the right ovary and right fallopian tube  2. Discussed scheduling surgery in August  3. Refer pt to [place]

## 2013-10-09 NOTE — Interval H&P Note (Signed)
History and Physical Interval Note:  10/09/2013 11:00 AM  Tiffany Ward  has presented today for surgery, with the diagnosis of Right ovarian dermoid  The various methods of treatment have been discussed with the patient and family. After consideration of risks, benefits and other options for treatment, the patient has consented to  Procedure(s): LAPAROSCOPIC OVARIAN CYSTECTOMY (Right) POSSIBLE LAPAROSCOPIC OOPHERECTOMY (Right) POSSIBLE LAPAROSCOPIC UNILATERAL SALPINGO OOPHORECTOMY (Right) as a surgical intervention .  The patient's history has been reviewed, patient examined, no change in status, stable for surgery.  I have reviewed the patient's chart and labs.  Questions were answered to the patient's satisfaction.   The patient mentioned just today that she would like to consider tubal sterilization, and we offered for her to postpone the removal of  The dermoid x 30 days so she could sign forms for Medicaid sterilization and reschedule combined surgery, but after consideration and discussion with her friend, she has decided to proceed with the removal of the dermoid today, and to stay with current contraception method for the foreseeable future.    Jonnie Kind

## 2013-10-09 NOTE — Anesthesia Postprocedure Evaluation (Signed)
2 Anesthesia Post-op Note  Patient: Tiffany Ward  Procedure(s) Performed: Procedure(s): LAPAROSCOPIC RIGHT OOPHORECTOMY (Right)  Patient Location: PACU  Anesthesia Type:General  Level of Consciousness: awake, alert , oriented and patient cooperative  Airway and Oxygen Therapy: Patient Spontanous Breathing and Patient connected to face mask oxygen  Post-op Pain: 3 /10, mild  Post-op Assessment: Post-op Vital signs reviewed, Patient's Cardiovascular Status Stable, Respiratory Function Stable, Patent Airway and Pain level controlled  Post-op Vital Signs: Reviewed and stable  Last Vitals:  Filed Vitals:   10/09/13 1345  BP:   Pulse: 82  Temp:   Resp: 29    Complications: No apparent anesthesia complications

## 2013-10-09 NOTE — Brief Op Note (Signed)
10/09/2013  1:55 PM  PATIENT:  Tiffany Ward  28 y.o. female  PRE-OPERATIVE DIAGNOSIS:  Right ovarian dermoid cyst  POST-OPERATIVE DIAGNOSIS:  Right ovarian dermoid cyst  PROCEDURE:  Procedure(s): LAPAROSCOPIC RIGHT OOPHORECTOMY (Right)  SURGEON:  Surgeon(s) and Role:    * Jonnie Kind, MD - Primary  PHYSICIAN ASSISTANT:  ASSISTANTS: Blackwell, CST, Henderson, CST  ANESTHESIA:   local and general  EBL:  Total I/O In: 1600 [I.V.:1600] Out: 325 [Urine:300; Blood:25]  BLOOD ADMINISTERED:none DRAINS: none   LOCAL MEDICATIONS USED:  MARCAINE    and Amount: 10 ml SPECIMEN:  Source of Specimen:  Right ovary  DISPOSITION OF SPECIMEN:  PATHOLOGY COUNTS:  YES  TOURNIQUET:  * No tourniquets in log * DICTATION: .Dragon Dictation  PLAN OF CARE: Discharge to home after PACU PATIENT DISPOSITION:  PACU - hemodynamically stable.   Delay start of Pharmacological VTE agent (>24hrs) due to surgical blood loss or risk of bleeding: not applicable  Details of procedure: Patient was taken to the operating room, prepped and draped for combined abdominal and vaginal procedure with sponge stick placed in the vagina for uterine manipulation, Foley catheter in place, and timeout conducted. Surgical procedure was confirmed by operative team. Attention was first directed to the umbilicus where a supraumbilical 1 cm skin incision was made with a with hemostat dissection down to the fascia. Veress needle was used to achieve pneumoperitoneum under a 10 mm mercury pressure, then laparoscopic trocar was introduced under laparoscopic visualization and showed no evidence of, associated with insufflation or peritoneal entry. Suprapubic right lower quadrant and left lower quadrant trochars were position, 5 mm on the side and mm in the midline the large dermoid cyst on the right was visible and documented. A left ovary with appeared grossly normal with 1 cyst dyspareunia surface and some small adhesions attaching  it to the left  sidewall. Trochars were position and attention directed to the right ovary. A laparoscopic 10-gauge needle was then used to aspirate 80 cc from the dermoid cyst of laxity sebaceous material. The needle then became plugged and was discontinued the hair plugged the laparoscopic site in the ovary and no spillage into the abdomen occurred. Photos documenting this. The decision was made to proceed with oophorectomy which was performed the harmonic scalpel with ovary removed intact has shown and photos 3 and 4. The left ovary was inspected and the thin adhesions floating the left ovary up into the side on the left sidewall were then dissected free with harmonic scalpel some the ovary would be more mobile. The right ovary was then placed in an Endo Catch bag through the suprapubic 12 mm trocar which was taken into the abdominal wall. Prior to tying to remove this, the lower abdominal incision was expanded to 4 cm in length of the skin and the fascia was opened to a similar length. The operator's index finger could be in used through this incision and through the trocar site to expand the opening sufficiently that there was no in situ extract the bag leading edge through the incision and then the bag was opened at its top and the dermoid cyst drained further by sharp leading opening the sebaceous cyst, and sectioning the thickened waxy material and a smaller cyst pocket of thinner waxy material from the dermoid cyst this allowed the entire dermoid cyst and extracted through the incision site. At no time was any spillage into the abdomen. The bag and extracted and was visualized as intact with residual fluid  within the bag. The lower incision was irrigated copiously, fascia closed running 0 Vicryl, subcutaneous tissues irrigated and reapproximated with interrupted 4-0 Vicryl and subcuticular 4-0 Vicryl completed the suprapubic closure. The abdomen was then reinsufflated Inspection the pelvis confirmed that  the abdomen was clear with no spillage and no bleeding. The abdomen had 120 cc of saline was placed in the abdomen to assist with evacuation of the carbon dioxide with and the abdomen was deflated, laparoscopy equipment and trochars having been removed,, then the umbilicus was closed with 0 Vicryl at the level of the fascia, then off through the remaining incisions were closed with subcuticular closure. 4-0 Vicryl was used. Steri-Strips and op sites were placed on the incision and patient allowed to go recovery room in good condition Marcaine was injected around the umbilicus and around suprapubic area prior to discharge to recovery period patient tolerated procedure well, sponge and needle counts were correct. Patient is scheduled for discharge later today.

## 2013-10-10 ENCOUNTER — Encounter (HOSPITAL_COMMUNITY): Payer: Self-pay | Admitting: Obstetrics and Gynecology

## 2013-10-16 ENCOUNTER — Encounter: Payer: Medicaid Other | Admitting: Obstetrics and Gynecology

## 2013-10-17 ENCOUNTER — Encounter: Payer: Self-pay | Admitting: Obstetrics and Gynecology

## 2013-10-17 ENCOUNTER — Ambulatory Visit (INDEPENDENT_AMBULATORY_CARE_PROVIDER_SITE_OTHER): Payer: Self-pay | Admitting: Obstetrics and Gynecology

## 2013-10-17 VITALS — BP 120/78 | Ht 69.5 in | Wt 282.0 lb

## 2013-10-17 DIAGNOSIS — Z9889 Other specified postprocedural states: Secondary | ICD-10-CM

## 2013-10-17 NOTE — Progress Notes (Signed)
This chart was scribed by Ludger Nutting, Medical Scribe, for Dr. Mallory Shirk on 10/17/13 at 10:51 AM. This chart was reviewed by Dr. Mallory Shirk for accuracy.   Subjective:  Tiffany Ward is a 28 y.o. female who presents to the clinic 1 week status post laparoscopic right oophorectomy. She denies any complaints or pain at this time.   Review of Systems Negative  She has been eating a regular diet without difficulty.   Bowel movements are normal. The patient is not having any pain.  Objective:  BP 120/78  Ht 5' 9.5" (1.765 m)  Wt 282 lb (127.914 kg)  BMI 41.06 kg/m2 General:Well developed, well nourished.  No acute distress. Abdomen: Bowel sounds normal, soft, non-tender. Pelvic Exam: not indicated   Incision(s):   Healing well, no drainage, no erythema, no hernia, no swelling, no dehiscence, incision well approximated.   Assessment:  Post-Op 1 weeks s/p laparoscopic right oophorectomy.    Doing well postoperatively.   Plan:  1.Wound care discussed   2. .Continue any current medications. 3. Activity restrictions: none 4. return to work: now. 5. Follow up in [place]

## 2016-12-07 ENCOUNTER — Ambulatory Visit (INDEPENDENT_AMBULATORY_CARE_PROVIDER_SITE_OTHER): Payer: Medicaid Other | Admitting: Urology

## 2016-12-07 ENCOUNTER — Other Ambulatory Visit (HOSPITAL_COMMUNITY)
Admission: RE | Admit: 2016-12-07 | Discharge: 2016-12-07 | Disposition: A | Discharge status: Home / Self Care | Payer: Medicaid Other | Source: Other Acute Inpatient Hospital | Attending: Urology | Admitting: Urology

## 2016-12-07 DIAGNOSIS — N201 Calculus of ureter: Secondary | ICD-10-CM | POA: Diagnosis not present

## 2016-12-07 DIAGNOSIS — R311 Benign essential microscopic hematuria: Secondary | ICD-10-CM | POA: Insufficient documentation

## 2016-12-07 LAB — URINALYSIS, COMPLETE (UACMP) WITH MICROSCOPIC
Bilirubin Urine: NEGATIVE
Glucose, UA: NEGATIVE mg/dL
Ketones, ur: NEGATIVE mg/dL
Leukocytes, UA: NEGATIVE
Nitrite: NEGATIVE
Protein, ur: NEGATIVE mg/dL
Specific Gravity, Urine: 1.023 (ref 1.005–1.030)
pH: 5 (ref 5.0–8.0)

## 2017-06-22 ENCOUNTER — Ambulatory Visit: Payer: Medicaid Other | Admitting: Obstetrics and Gynecology

## 2017-06-22 ENCOUNTER — Encounter: Payer: Self-pay | Admitting: Obstetrics and Gynecology

## 2017-06-22 VITALS — BP 120/60 | HR 58 | Ht 69.5 in | Wt 301.8 lb

## 2017-06-22 DIAGNOSIS — N83202 Unspecified ovarian cyst, left side: Secondary | ICD-10-CM | POA: Diagnosis not present

## 2017-06-22 DIAGNOSIS — Z6841 Body Mass Index (BMI) 40.0 and over, adult: Secondary | ICD-10-CM

## 2017-06-22 DIAGNOSIS — D270 Benign neoplasm of right ovary: Secondary | ICD-10-CM | POA: Diagnosis not present

## 2017-06-22 NOTE — Progress Notes (Addendum)
   [facility] Visit  06/22/2017            Patient name: Tiffany Ward MRN [id number]  Date of birth: [birth year]  CC & HPI:  Tiffany Ward is a 32 y.o. female presenting today for a possible cyst on her left ovary. She is concerned it may grow to the size of the cyst she previously had on her right ovary. No associated symptoms noted. Pt has not tried any medications for relief. No alleviating factors noted. Last female exam was in June. She has had a laparoscopic right oophorectomy in 2015. Last pap was in 2014, negative, with mild dysplasia of cervix. The CT was done at [facility] and not currently available to me  ROS:  ROS (-) fever (-) chills All systems are negative except as noted in the HPI and PMH.   Pertinent History Reviewed:   Reviewed: Significant for right oophorectomy, dermoid ovarian cyst Medical         Past Medical History:  Diagnosis Date  . Deafness in left ear   . Diabetes mellitus without complication ([place])   . Diverticulitis   . Sleep apnea    does not use cpap                              Surgical Hx:    Past Surgical History:  Procedure Laterality Date  . CESAREAN SECTION    . LAPAROSCOPIC SALPINGO OOPHERECTOMY Right 10/09/2013   Procedure: LAPAROSCOPIC RIGHT OOPHORECTOMY;  Surgeon: Jonnie Kind, MD;  Location: [facility];  Service: Gynecology;  Laterality: Right;  . LT.EAR SURGERY     Medications: Reviewed & Updated - see associated section                       Current Outpatient Medications:  .  etonogestrel (NEXPLANON) 68 MG IMPL implant, 68 mg., Disp: , Rfl:    Social History: Reviewed -  reports that she has never smoked. She has never used smokeless tobacco.  Objective Findings:  Vitals: Blood pressure 120/60, pulse (!) 58, height 5' 9.5" (1.765 m), weight (!) 301 lb 12.8 oz (136.9 kg).  PHYSICAL EXAMINATION General appearance - alert, well appearing, and in no distress and oriented to person, place, and time Mental status -  alert, oriented to person, place, and time, normal mood, behavior, speech, dress, motor activity, and thought processes   PELVIC: Not indicated    Assessment & Plan:   A:  1.  hx of ovarian cyst, left ovary on recent CT at outside facility 2. Status post right oophorectomy due to 14 cm benign right ovarian cyst 3.  Morbid obesity precludes adequate bimanual exam P:  1.  Schedule a transvaginal U/S in 1 week 2.  Follow-up by phone or as needed By signing my name below, I, Izna Ahmed, attest that this documentation has been prepared under the direction and in the presence of Jonnie Kind, MD. Electronically Signed: Jabier Gauss, Medical Scribe. 06/22/17. 9:19 AM.  I personally performed the services described in this documentation, which was SCRIBED in my presence. The recorded information has been reviewed and considered accurate. It has been edited as necessary during review. Jonnie Kind, MD

## 2017-06-28 ENCOUNTER — Other Ambulatory Visit: Payer: Self-pay | Admitting: Obstetrics and Gynecology

## 2017-06-28 DIAGNOSIS — N83202 Unspecified ovarian cyst, left side: Secondary | ICD-10-CM

## 2017-07-01 ENCOUNTER — Telehealth: Payer: Self-pay | Admitting: Obstetrics and Gynecology

## 2017-07-01 ENCOUNTER — Ambulatory Visit (INDEPENDENT_AMBULATORY_CARE_PROVIDER_SITE_OTHER): Payer: Medicaid Other

## 2017-07-01 DIAGNOSIS — N83202 Unspecified ovarian cyst, left side: Secondary | ICD-10-CM | POA: Diagnosis not present

## 2017-07-01 NOTE — Progress Notes (Signed)
PELVIC US TA/TV: homogeneous anteverted uterus,wnl,EEC 6.9 mm,right adnexa wnl,right oophorectomy,simple left ovarian cyst 3.5 x 2.9 x 3.2 cm,limited view of left ovary because of high position,no free fluid,no pain during ultrasound

## 2017-07-10 NOTE — Telephone Encounter (Signed)
unABLE TO reach pt by phone GR:MBOBOF ov cyst on left. Pt sent the results in [place]

## 2017-12-02 ENCOUNTER — Encounter (HOSPITAL_COMMUNITY): Payer: Self-pay | Admitting: Emergency Medicine

## 2017-12-02 ENCOUNTER — Emergency Department (HOSPITAL_COMMUNITY)
Admission: EM | Admit: 2017-12-02 | Discharge: 2017-12-03 | Disposition: A | Discharge status: Home / Self Care | Payer: Medicaid Other | Attending: Emergency Medicine | Admitting: Emergency Medicine

## 2017-12-02 ENCOUNTER — Other Ambulatory Visit: Payer: Self-pay

## 2017-12-02 DIAGNOSIS — N390 Urinary tract infection, site not specified: Secondary | ICD-10-CM | POA: Insufficient documentation

## 2017-12-02 DIAGNOSIS — E119 Type 2 diabetes mellitus without complications: Secondary | ICD-10-CM | POA: Insufficient documentation

## 2017-12-02 DIAGNOSIS — Z79899 Other long term (current) drug therapy: Secondary | ICD-10-CM | POA: Insufficient documentation

## 2017-12-02 DIAGNOSIS — N72 Inflammatory disease of cervix uteri: Secondary | ICD-10-CM | POA: Diagnosis not present

## 2017-12-02 DIAGNOSIS — N898 Other specified noninflammatory disorders of vagina: Secondary | ICD-10-CM | POA: Diagnosis present

## 2017-12-02 LAB — URINALYSIS, ROUTINE W REFLEX MICROSCOPIC
Bilirubin Urine: NEGATIVE
Glucose, UA: NEGATIVE mg/dL
Ketones, ur: NEGATIVE mg/dL
Nitrite: NEGATIVE
Protein, ur: 30 mg/dL — AB
Specific Gravity, Urine: 1.021 (ref 1.005–1.030)
WBC, UA: >50 WBC/hpf — ABNORMAL HIGH (ref 0–5)
pH: 6 (ref 5.0–8.0)

## 2017-12-02 LAB — WET PREP, GENITAL
Clue Cells Wet Prep HPF POC: NONE SEEN
Sperm: NONE SEEN
Trich, Wet Prep: NONE SEEN
Yeast Wet Prep HPF POC: NONE SEEN

## 2017-12-02 LAB — CBG MONITORING, ED: Glucose-Capillary: 120 mg/dL — ABNORMAL HIGH (ref 70–99)

## 2017-12-02 LAB — PREGNANCY, URINE: Preg Test, Ur: NEGATIVE

## 2017-12-02 MED ORDER — AZITHROMYCIN 250 MG PO TABS
1000.0000 mg | ORAL_TABLET | Freq: Once | ORAL | Status: AC
  Administered 2017-12-03: 1000 mg via ORAL
  Filled 2017-12-02: qty 4

## 2017-12-02 MED ORDER — CEFTRIAXONE SODIUM 250 MG IJ SOLR
250.0000 mg | Freq: Once | INTRAMUSCULAR | Status: AC
  Administered 2017-12-03: 250 mg via INTRAMUSCULAR
  Filled 2017-12-02: qty 250

## 2017-12-02 NOTE — ED Triage Notes (Signed)
Pt states she has a yeast infection. Seen pcp Monday and Tuesday. Pt c/o itching and burning. Dx Tuesday with yeast infection. Took last rx pill yesterday. Pt rx UTI med  Tuesday also.

## 2017-12-03 MED ORDER — STERILE WATER FOR INJECTION IJ SOLN
INTRAMUSCULAR | Status: AC
  Administered 2017-12-03: 10 mL
  Filled 2017-12-03: qty 10

## 2017-12-03 NOTE — ED Provider Notes (Signed)
[facility] EMERGENCY DEPARTMENT Provider Note   CSN: [id number] Arrival date & time: 12/02/17  2047     History   Chief Complaint Chief Complaint  Patient presents with  . Vaginitis    HPI Tiffany Ward is a 32 y.o. female.  The history is provided by the patient. No language interpreter was used.  Vaginal Discharge   This is a new problem. The current episode started more than 1 week ago. The problem occurs constantly. The problem has been gradually worsening. The discharge was yellow. She has tried nothing for the symptoms. The treatment provided no relief.   Pt has used monistat and 2 dosages of diflucan for yeast vaginitis. Pt reports vaginal pain and burning with urination.  Past Medical History:  Diagnosis Date  . Deafness in left ear   . Diabetes mellitus without complication ([place])   . Diverticulitis   . Sleep apnea    does not use cpap    Patient Active Problem List   Diagnosis Date Noted  . Post-operative state 10/17/2013  . Obesity, morbid ([place]) 10/09/2013  . PCOS (polycystic ovarian syndrome) 10/24/2012  . Mild dysplasia of cervix 10/24/2012    Past Surgical History:  Procedure Laterality Date  . CESAREAN SECTION    . LAPAROSCOPIC SALPINGO OOPHERECTOMY Right 10/09/2013   Procedure: LAPAROSCOPIC RIGHT OOPHORECTOMY;  Surgeon: Jonnie Kind, MD;  Location: [facility];  Service: Gynecology;  Laterality: Right;  . LT.EAR SURGERY       OB History    Gravida  1   Para  1   Term  1   Preterm      AB      Living  1     SAB      TAB      Ectopic      Multiple      Live Births  1            Home Medications    Prior to Admission medications   Medication Sig Start Date End Date Taking? Authorizing Provider  etonogestrel (NEXPLANON) 68 MG IMPL implant 68 mg.   Yes [provider]  ibuprofen (ADVIL,MOTRIN) 800 MG tablet Take 800 mg by mouth every 8 (eight) hours as needed. For pain 11/25/17  Yes [provider]    lactobacillus acidophilus (BACID) TABS tablet Take 2 tablets by mouth daily.   Yes [provider]  nitrofurantoin, macrocrystal-monohydrate, (MACROBID) 100 MG capsule Take 100 mg by mouth 2 (two) times daily. 5 day course starting on 11/30/2017 11/30/17  Yes [provider]  ondansetron (ZOFRAN-ODT) 4 MG disintegrating tablet Take 4 mg by mouth every 8 (eight) hours as needed. 11/08/17  Yes [provider]  traMADol-acetaminophen (ULTRACET) 37.5-325 MG tablet Take 1 tablet by mouth every 4 (four) hours as needed. For pain 11/25/17  Yes [provider]  amoxicillin (AMOXIL) 500 MG capsule Take 500 mg by mouth 4 (four) times daily. 7 day course starting on 11/25/2017 11/25/17   [provider]  fluconazole (DIFLUCAN) 150 MG tablet Take 150 mg by mouth See admin instructions. Take one tablet by mouth on 11/28/2017 then Repeat dose on 12/01/2017 11/28/17   [provider]    Family History Family History  Problem Relation Age of Onset  . Diabetes Mother   . Hypertension Mother   . Hypertension Father   . Hypertension Sister   . Hypertension Paternal Grandmother   . Hypertension Sister   . Diabetes Sister  Social History Social History   Tobacco Use  . Smoking status: Never Smoker  . Smokeless tobacco: Never Used  . Tobacco comment: never used snuff or chewing tobacco.  Substance Use Topics  . Alcohol use: No  . Drug use: No     Allergies   Codeine   Review of Systems Review of Systems  Genitourinary: Positive for vaginal discharge.  All other systems reviewed and are negative.    Physical Exam Updated Vital Signs BP (!) 175/124 (BP Location: Right Arm)   Pulse 65   Temp 98.2 F (36.8 C) (Oral)   Resp 18   Ht 5\' 9"  (1.753 m)   Wt 131.5 kg   LMP  (LMP Unknown)   SpO2 99%   BMI 42.83 kg/m   Physical Exam  Constitutional: She appears well-developed and well-nourished.  HENT:  Head: Normocephalic.   Cardiovascular: Normal rate.  Pulmonary/Chest: Effort normal.  Genitourinary: Vaginal discharge found.  Genitourinary Comments: Erythema labia and vaginal vault   Musculoskeletal: Normal range of motion.  Neurological: She is alert.  Skin: Skin is warm.  Psychiatric: She has a normal mood and affect.  Nursing note and vitals reviewed.    ED Treatments / Results  Labs (all labs ordered are listed, but only abnormal results are displayed) Labs Reviewed  WET PREP, GENITAL - Abnormal; Notable for the following components:      Result Value   WBC, Wet Prep HPF POC MANY (*)    All other components within normal limits  URINALYSIS, ROUTINE W REFLEX MICROSCOPIC - Abnormal; Notable for the following components:   APPearance CLOUDY (*)    Hgb urine dipstick MODERATE (*)    Protein, ur 30 (*)    Leukocytes, UA LARGE (*)    WBC, UA >50 (*)    Bacteria, UA RARE (*)    All other components within normal limits  CBG MONITORING, ED - Abnormal; Notable for the following components:   Glucose-Capillary 120 (*)    All other components within normal limits  PREGNANCY, URINE  CBG MONITORING, ED  GC/CHLAMYDIA PROBE AMP ([place]) NOT AT [facility]    EKG None  Radiology No results found.  Procedures Procedures (including critical care time)  Medications Ordered in ED Medications  cefTRIAXone (ROCEPHIN) injection 250 mg (250 mg Intramuscular Given 12/03/17 0004)  azithromycin (ZITHROMAX) tablet 1,000 mg (1,000 mg Oral Given 12/03/17 0004)  sterile water (preservative free) injection (10 mLs  Given 12/03/17 0004)     Initial Impression / Assessment and Plan / ED Course  I have reviewed the triage vital signs and the nursing notes.  Pertinent labs & imaging results that were available during my care of the patient were reviewed by me and considered in my medical decision making (see chart for details).     MDM  Pt had chlamydia treated a month ago.  Pt reports she and partner were  treated.  Urine shows many wbcs and bacteria.  Wet prep no yeast.  I advised I am concerned for std.  Pt given rocephin and zithromax.  Pt advised to follow up with her GYn for recheck   Final Clinical Impressions(s) / ED Diagnoses   Final diagnoses:  Cervicitis  Urinary tract infection without hematuria, site unspecified    ED Discharge Orders    None    An After Visit Summary was printed and given to the patient.    Fransico Meadow, PA-C 12/03/17 0024    Merrily Pew, MD 12/03/17 2238

## 2017-12-03 NOTE — Discharge Instructions (Addendum)
See your Physician for recheck next week.  Continue antibiotics for uti

## 2017-12-05 LAB — GC/CHLAMYDIA PROBE AMP (~~LOC~~) NOT AT ARMC
Chlamydia: NEGATIVE
Neisseria Gonorrhea: NEGATIVE

## 2019-07-19 ENCOUNTER — Emergency Department (HOSPITAL_COMMUNITY): Payer: Medicaid Other

## 2019-07-19 ENCOUNTER — Emergency Department (HOSPITAL_COMMUNITY)
Admission: EM | Admit: 2019-07-19 | Discharge: 2019-07-19 | Disposition: A | Discharge status: Home / Self Care | Payer: Medicaid Other | Attending: Emergency Medicine | Admitting: Emergency Medicine

## 2019-07-19 ENCOUNTER — Other Ambulatory Visit: Payer: Self-pay

## 2019-07-19 ENCOUNTER — Encounter (HOSPITAL_COMMUNITY): Payer: Self-pay | Admitting: *Deleted

## 2019-07-19 DIAGNOSIS — N898 Other specified noninflammatory disorders of vagina: Secondary | ICD-10-CM | POA: Insufficient documentation

## 2019-07-19 DIAGNOSIS — Z79899 Other long term (current) drug therapy: Secondary | ICD-10-CM | POA: Diagnosis not present

## 2019-07-19 DIAGNOSIS — R350 Frequency of micturition: Secondary | ICD-10-CM | POA: Insufficient documentation

## 2019-07-19 DIAGNOSIS — E119 Type 2 diabetes mellitus without complications: Secondary | ICD-10-CM | POA: Diagnosis not present

## 2019-07-19 DIAGNOSIS — R109 Unspecified abdominal pain: Secondary | ICD-10-CM | POA: Diagnosis present

## 2019-07-19 DIAGNOSIS — K5792 Diverticulitis of intestine, part unspecified, without perforation or abscess without bleeding: Secondary | ICD-10-CM

## 2019-07-19 DIAGNOSIS — Z7984 Long term (current) use of oral hypoglycemic drugs: Secondary | ICD-10-CM | POA: Insufficient documentation

## 2019-07-19 DIAGNOSIS — R319 Hematuria, unspecified: Secondary | ICD-10-CM | POA: Insufficient documentation

## 2019-07-19 LAB — BASIC METABOLIC PANEL
Anion gap: 8 (ref 5–15)
BUN: 15 mg/dL (ref 6–20)
CO2: 26 mmol/L (ref 22–32)
Calcium: 9 mg/dL (ref 8.9–10.3)
Chloride: 103 mmol/L (ref 98–111)
Creatinine, Ser: 0.63 mg/dL (ref 0.44–1.00)
GFR calc Af Amer: >60 mL/min (ref >60)
GFR calc non Af Amer: >60 mL/min (ref >60)
Glucose, Bld: 183 mg/dL — ABNORMAL HIGH (ref 70–99)
Potassium: 3.9 mmol/L (ref 3.5–5.1)
Sodium: 137 mmol/L (ref 135–145)

## 2019-07-19 LAB — URINALYSIS, ROUTINE W REFLEX MICROSCOPIC
Bilirubin Urine: NEGATIVE
Glucose, UA: NEGATIVE mg/dL
Ketones, ur: NEGATIVE mg/dL
Leukocytes,Ua: NEGATIVE
Nitrite: NEGATIVE
Protein, ur: 100 mg/dL — AB
RBC / HPF: >50 RBC/hpf — ABNORMAL HIGH (ref 0–5)
Specific Gravity, Urine: 1.023 (ref 1.005–1.030)
pH: 6 (ref 5.0–8.0)

## 2019-07-19 LAB — CBC WITH DIFFERENTIAL/PLATELET
Abs Immature Granulocytes: 0.04 10*3/uL (ref 0.00–0.07)
Basophils Absolute: 0.1 10*3/uL (ref 0.0–0.1)
Basophils Relative: 1 %
Eosinophils Absolute: 0.2 10*3/uL (ref 0.0–0.5)
Eosinophils Relative: 2 %
HCT: 43.8 % (ref 36.0–46.0)
Hemoglobin: 14.2 g/dL (ref 12.0–15.0)
Immature Granulocytes: 0 %
Lymphocytes Relative: 30 %
Lymphs Abs: 3.2 10*3/uL (ref 0.7–4.0)
MCH: 30.1 pg (ref 26.0–34.0)
MCHC: 32.4 g/dL (ref 30.0–36.0)
MCV: 93 fL (ref 80.0–100.0)
Monocytes Absolute: 0.7 10*3/uL (ref 0.1–1.0)
Monocytes Relative: 7 %
Neutro Abs: 6.6 10*3/uL (ref 1.7–7.7)
Neutrophils Relative %: 60 %
Platelets: 265 10*3/uL (ref 150–400)
RBC: 4.71 MIL/uL (ref 3.87–5.11)
RDW: 13 % (ref 11.5–15.5)
WBC: 10.8 10*3/uL — ABNORMAL HIGH (ref 4.0–10.5)
nRBC: 0 % (ref 0.0–0.2)

## 2019-07-19 LAB — POC URINE PREG, ED: Preg Test, Ur: NEGATIVE

## 2019-07-19 MED ORDER — ONDANSETRON 4 MG PO TBDP: 4.0000 mg | ORAL_TABLET | Freq: Three times a day (TID) | ORAL | 0 refills | Status: DC | PRN

## 2019-07-19 MED ORDER — CIPROFLOXACIN HCL 500 MG PO TABS: 500.0000 mg | ORAL_TABLET | Freq: Two times a day (BID) | ORAL | 0 refills | Status: AC

## 2019-07-19 MED ORDER — METRONIDAZOLE 500 MG PO TABS: 500.0000 mg | ORAL_TABLET | Freq: Two times a day (BID) | ORAL | 0 refills | Status: AC

## 2019-07-19 NOTE — ED Provider Notes (Signed)
[facility] EMERGENCY DEPARTMENT Provider Note   CSN: [id number] Arrival date & time: 07/19/19  1241     History Chief Complaint  Patient presents with  . Flank Pain    Tiffany Ward is a 34 y.o. female with pertinent past medical history of diabetes, PCOS, obesity that presents to the emergency department today with right flank pain for the last 2 weeks.  States that the pain does not radiate anywhere and ibuprofen does provide some relief.  Patient states that this does feel like her previous kidney stone, patient did have kidney stone 2 years ago that did pass without intervention.  Patient states that for the past 2 days she has noticed hematuria, denies any dysuria, frequent urination, vaginal discharge, vaginal bleeding.  Denies any abdominal pain, pelvic pain.  Denies any back pain elsewhere.  Denies any nausea, vomiting, chest pain, shortness of breath, dizziness, weakness fevers, chills..  Patient is able to work through the pain.  States that the pain is a 6/10.  Came to the emergency department because she noticed hematuria. States that she has a Nexplanon, does not get her periods regularly.   HPI     Past Medical History:  Diagnosis Date  . Deafness in left ear   . Diabetes mellitus without complication ([place])   . Diverticulitis   . Sleep apnea    does not use cpap    Patient Active Problem List   Diagnosis Date Noted  . Post-operative state 10/17/2013  . Obesity, morbid ([place]) 10/09/2013  . PCOS (polycystic ovarian syndrome) 10/24/2012  . Mild dysplasia of cervix 10/24/2012    Past Surgical History:  Procedure Laterality Date  . CESAREAN SECTION    . LAPAROSCOPIC SALPINGO OOPHERECTOMY Right 10/09/2013   Procedure: LAPAROSCOPIC RIGHT OOPHORECTOMY;  Surgeon: Jonnie Kind, MD;  Location: [facility];  Service: Gynecology;  Laterality: Right;  . LT.EAR SURGERY       OB History    Gravida  1   Para  1   Term  1   Preterm      AB      Living  1     SAB        TAB      Ectopic      Multiple      Live Births  1           Family History  Problem Relation Age of Onset  . Diabetes Mother   . Hypertension Mother   . Hypertension Father   . Hypertension Sister   . Hypertension Paternal Grandmother   . Hypertension Sister   . Diabetes Sister     Social History   Tobacco Use  . Smoking status: Never Smoker  . Smokeless tobacco: Never Used  . Tobacco comment: never used snuff or chewing tobacco.  Substance Use Topics  . Alcohol use: No  . Drug use: No    Home Medications Prior to Admission medications   Medication Sig Start Date End Date Taking? Authorizing Provider  atorvastatin (LIPITOR) 10 MG tablet Take 10 mg by mouth at bedtime. 07/03/19  Yes [provider]  Cholecalciferol (VITAMIN D3) 1.25 MG (50000 UT) CAPS Take 1 capsule by mouth every Tuesday. evening 07/03/19  Yes [provider]  etonogestrel (NEXPLANON) 68 MG IMPL implant 68 mg by Subdermal route once.    Yes [provider]  levofloxacin (LEVAQUIN) 500 MG tablet Take 500 mg by mouth daily. 10 day course prescribed on 07/13/2019 07/13/19  Yes [provider]  metFORMIN (GLUCOPHAGE-XR) 500 MG 24 hr tablet Take 500 mg by mouth at bedtime. 07/04/19  Yes [provider]  montelukast (SINGULAIR) 10 MG tablet Take 10 mg by mouth at bedtime.  07/03/19  Yes [provider]  ciprofloxacin (CIPRO) 500 MG tablet Take 1 tablet (500 mg total) by mouth 2 (two) times daily for 10 days. 07/19/19 07/29/19  Fransico Meadow, PA-C  metroNIDAZOLE (FLAGYL) 500 MG tablet Take 1 tablet (500 mg total) by mouth 2 (two) times daily for 10 days. 07/19/19 07/29/19  Fransico Meadow, PA-C  ondansetron (ZOFRAN ODT) 4 MG disintegrating tablet Take 1 tablet (4 mg total) by mouth every 8 (eight) hours as needed for nausea or vomiting. 07/19/19   Fransico Meadow, PA-C    Allergies    Codeine  Review of Systems   Review of Systems  Constitutional:  Negative for chills, diaphoresis, fatigue and fever.  HENT: Negative for congestion, sore throat and trouble swallowing.   Eyes: Negative for pain and visual disturbance.  Respiratory: Negative for cough, shortness of breath and wheezing.   Cardiovascular: Negative for chest pain, palpitations and leg swelling.  Gastrointestinal: Negative for abdominal distention, abdominal pain, diarrhea, nausea and vomiting.  Genitourinary: Positive for flank pain and hematuria. Negative for decreased urine volume, difficulty urinating, dyspareunia, dysuria, enuresis, frequency, menstrual problem, pelvic pain, urgency, vaginal bleeding, vaginal discharge and vaginal pain.  Musculoskeletal: Negative for back pain, neck pain and neck stiffness.  Skin: Negative for pallor.  Neurological: Negative for dizziness, speech difficulty, weakness and headaches.  Psychiatric/Behavioral: Negative for confusion.    Physical Exam Updated Vital Signs BP 121/75 (BP Location: Right Arm)   Pulse 67   Temp 98.5 F (36.9 C) (Oral)   Resp 16   Ht 5\' 8"  (1.727 m)   Wt 136.1 kg   SpO2 100%   BMI 45.61 kg/m   Physical Exam Constitutional:      General: She is not in acute distress.    Appearance: Normal appearance. She is not ill-appearing, toxic-appearing or diaphoretic.  HENT:     Mouth/Throat:     Mouth: Mucous membranes are moist.     Pharynx: Oropharynx is clear.  Eyes:     General: No scleral icterus.    Extraocular Movements: Extraocular movements intact.     Pupils: Pupils are equal, round, and reactive to light.  Cardiovascular:     Rate and Rhythm: Normal rate and regular rhythm.     Pulses: Normal pulses.     Heart sounds: Normal heart sounds.  Pulmonary:     Effort: Pulmonary effort is normal. No respiratory distress.     Breath sounds: Normal breath sounds. No stridor. No wheezing, rhonchi or rales.  Chest:     Chest wall: No tenderness.  Abdominal:     General: Abdomen is flat. There is no  distension.     Palpations: Abdomen is soft.     Tenderness: There is no abdominal tenderness. There is right CVA tenderness. There is no left CVA tenderness, guarding or rebound.  Musculoskeletal:        General: No swelling or tenderness. Normal range of motion.     Cervical back: Normal range of motion and neck supple. No rigidity.     Right lower leg: No edema.     Left lower leg: No edema.  Skin:    General: Skin is warm and dry.     Capillary Refill: Capillary refill takes less  than 2 seconds.     Coloration: Skin is not pale.  Neurological:     General: No focal deficit present.     Mental Status: She is alert and oriented to person, place, and time.  Psychiatric:        Mood and Affect: Mood normal.        Behavior: Behavior normal.     ED Results / Procedures / Treatments   Labs (all labs ordered are listed, but only abnormal results are displayed) Labs Reviewed  URINALYSIS, ROUTINE W REFLEX MICROSCOPIC - Abnormal; Notable for the following components:      Result Value   Color, Urine AMBER (*)    APPearance CLOUDY (*)    Hgb urine dipstick LARGE (*)    Protein, ur 100 (*)    RBC / HPF >50 (*)    Bacteria, UA RARE (*)    All other components within normal limits  BASIC METABOLIC PANEL - Abnormal; Notable for the following components:   Glucose, Bld 183 (*)    All other components within normal limits  CBC WITH DIFFERENTIAL/PLATELET - Abnormal; Notable for the following components:   WBC 10.8 (*)    All other components within normal limits  CBC WITH DIFFERENTIAL/PLATELET  CBC WITH DIFFERENTIAL/PLATELET  POC URINE PREG, ED    EKG None  Radiology CT Renal Stone Study  Result Date: 07/19/2019 CLINICAL DATA:  Flank pain, right EXAM: CT ABDOMEN AND PELVIS WITHOUT CONTRAST TECHNIQUE: Multidetector CT imaging of the abdomen and pelvis was performed following the standard protocol without IV contrast. COMPARISON:  None. FINDINGS: LOWER CHEST: Normal. HEPATOBILIARY:  Diffuse hypoattenuation of the liver relative to the spleen suggests hepatic steatosis. No focal liver lesion or biliary dilatation. There is cholelithiasis without acute inflammation. PANCREAS: Normal pancreas. No ductal dilatation or peripancreatic fluid collection. SPLEEN: Normal. ADRENALS/URINARY TRACT: The adrenal glands are normal. There is no hydronephrosis. No right nephroureterolithiasis. On the left, there is a 3 mm stone at the renal pelvis without evidence of obstruction. There is also a 5 mm lower pole stone. The urinary bladder is normal for degree of distention STOMACH/BOWEL: There is no hiatal hernia. Normal duodenal course and caliber. No small bowel dilatation or inflammation. There is rectosigmoid diverticulosis with acute inflammation. Six no free intraperitoneal air or fluid collection. Normal appendix. VASCULAR/LYMPHATIC: Normal course and caliber of the major abdominal vessels. No abdominal or pelvic lymphadenopathy. REPRODUCTIVE: Normal uterus and ovaries. MUSCULOSKELETAL. No bony spinal canal stenosis or focal osseous abnormality. OTHER: None. IMPRESSION: 1. Acute sigmoid diverticulitis without abscess or free intraperitoneal air. 2. Nonobstructive left nephrolithiasis measuring up to 5 mm. 3. No right hydronephrosis or nephroureterolithiasis. 4. Hepatic steatosis and cholelithiasis without acute inflammation. Electronically Signed   By: Ulyses Jarred M.D.   On: 07/19/2019 16:22    Procedures Procedures (including critical care time)  Medications Ordered in ED Medications - No data to display  ED Course  I have reviewed the triage vital signs and the nursing notes.  Pertinent labs & imaging results that were available during my care of the patient were reviewed by me and considered in my medical decision making (see chart for details).    MDM Rules/Calculators/A&P                     Anye Wayner is a 34 y.o. female with pertinent past medical history of diabetes, PCOS,  obesity that presents to the emergency department today with right flank pain for the last  2 weeks.  .Differential diagnoses considered include renal stone, muscular strain, pyelonephritis.   Initial interventions none - pt states she does not want anything for pain right now.   Labs demonstrated of normal CMP, UA with Hgb and protein. UA suggestive of kidney stone, do not think pt needs urine culture. No frequent urination or dysuria. CT with nonobstructive stone that is 57mm at renal pole and 3 mm at renal pelvis. Sigmoid diverticulitis without complication also noted. Pt without any abdominal pain. Pt is aware of cholelithasis. Vitals stable.   Given the above findings, my suspicion is that pt is having pain due to kidney stone, pt also could be having pain due to diverticulitis, or could be incidental finding. Will treat for Diverticulitis. Will have pt follow up with GI. DO not need to treat stones at this time.   Pt care was handed off to [place] PA-C at 4:39.  Complete history and physical and current plan have been communicated.  Please refer to their note for the remainder of ED care and ultimate disposition. Awaiting CBC.   Final Clinical Impression(s) / ED Diagnoses Final diagnoses:  Acute diverticulitis    Rx / DC Orders ED Discharge Orders         Ordered    metroNIDAZOLE (FLAGYL) 500 MG tablet  2 times daily     07/19/19 1849    ciprofloxacin (CIPRO) 500 MG tablet  2 times daily     07/19/19 1849    ondansetron (ZOFRAN ODT) 4 MG disintegrating tablet  Every 8 hours PRN     07/19/19 1849           Alfredia Client, PA-C 07/19/19 2116    Daleen Bo, MD 07/20/19 1746

## 2019-07-19 NOTE — Discharge Instructions (Addendum)
Schedule to see Gi for evaluation

## 2019-07-19 NOTE — ED Triage Notes (Signed)
Pt with right flank pain for 2 weeks and for past 2 days pt with blood noted in urine.

## 2019-07-19 NOTE — ED Provider Notes (Signed)
Pt's care assumed at 4:30om.  Cbc pending.  Cbc shows slight elevation of wbc count consistent with diverticultis.  Pt given rx for cipro, flagyl and zofran.  An After Visit Summary was printed and given to the patient. Referral to gi for follow up   Sidney Ace 07/19/19 1850    Daleen Bo, MD 07/20/19 [phone]

## 2019-07-31 ENCOUNTER — Telehealth: Payer: Self-pay

## 2019-07-31 NOTE — Telephone Encounter (Signed)
Pt is aware of OV on Friday at 0830

## 2019-07-31 NOTE — Telephone Encounter (Signed)
Pt was seen in the ER and recommended to follow up with [place]. Can we accept her as a new patient?

## 2019-07-31 NOTE — Telephone Encounter (Signed)
Ok to schedule ov.  

## 2019-08-01 ENCOUNTER — Ambulatory Visit: Payer: Medicaid Other | Admitting: Gastroenterology

## 2019-08-03 ENCOUNTER — Ambulatory Visit (INDEPENDENT_AMBULATORY_CARE_PROVIDER_SITE_OTHER): Payer: Medicaid Other | Admitting: Gastroenterology

## 2019-08-03 ENCOUNTER — Other Ambulatory Visit: Payer: Self-pay

## 2019-08-03 ENCOUNTER — Encounter: Payer: Self-pay | Admitting: Gastroenterology

## 2019-08-03 VITALS — BP 133/78 | HR 63 | Temp 97.5°F | Ht 70.0 in | Wt 311.2 lb

## 2019-08-03 DIAGNOSIS — K5792 Diverticulitis of intestine, part unspecified, without perforation or abscess without bleeding: Secondary | ICD-10-CM

## 2019-08-03 DIAGNOSIS — K760 Fatty (change of) liver, not elsewhere classified: Secondary | ICD-10-CM

## 2019-08-03 NOTE — Progress Notes (Signed)
Primary Care Physician:  [facility]  Primary Gastroenterologist:  Garfield Cornea, MD   Chief Complaint  Patient presents with  . Diverticulitis    went to ED; better now    HPI:  Tiffany Ward is a 34 y.o. female here for further evaluation of diverticulitis.  Patient went to the ED May 20 with complaints of right flank pain and gross hematuria.  CT renal protocol showed sigmoid diverticulitis.  At that time she was not having any left-sided abdominal pain.  It was felt that her symptoms were likely due to kidney stone and the diverticulitis was an incidental finding.  She was treated with Cipro and Flagyl.  CT also showed fatty liver and cholelithiasis..  Patient states she has a history of diverticulitis, has been hospitalized 2-3 times at [facility].  Has been more than a year since her last episode.  She had a colonoscopy about 10 years ago with Dr. West Carbo in [place].  Denies any history of polyps.  Family history significant for multiple family members with diverticulitis, no history of complicating features.  Patient also has a history of kidney stones, has passed stones twice.  Patient has noncontrasted CTs in 2015 and 2018 with similar findings at the sigmoid colon in his current CT.  Bowel movements are all over the place.  Worse the past year.  Can go from constipation to diarrhea the same day.  Some days has more than 10 stools, nocturnal diarrhea.  No melena or rectal bleeding.  She has heartburn which is diet controlled.  She avoids greasy foods, does not eat late at night.  Tums provides relief but does not have to take on a regular basis.  Denies dysphagia or vomiting. Really not having left abdominal pain at this time.    Current Outpatient Medications  Medication Sig Dispense Refill  . atorvastatin (LIPITOR) 10 MG tablet Take 10 mg by mouth at bedtime.    . Cholecalciferol (VITAMIN D3) 1.25 MG (50000 UT) CAPS Take 1 capsule by mouth every  Tuesday. evening    . etonogestrel (NEXPLANON) 68 MG IMPL implant 68 mg by Subdermal route once.     . metFORMIN (GLUCOPHAGE-XR) 500 MG 24 hr tablet Take 500 mg by mouth at bedtime.    . montelukast (SINGULAIR) 10 MG tablet Take 10 mg by mouth at bedtime.      No current facility-administered medications for this visit.    Allergies as of 08/03/2019 - Review Complete 08/03/2019  Allergen Reaction Noted  . Codeine Itching and Rash 08/15/2013    Past Medical History:  Diagnosis Date  . Deafness in left ear   . Diabetes mellitus without complication ([place])   . Diverticulitis   . Sleep apnea    does not use cpap    Past Surgical History:  Procedure Laterality Date  . CESAREAN SECTION    . LAPAROSCOPIC SALPINGO OOPHERECTOMY Right 10/09/2013   Procedure: LAPAROSCOPIC RIGHT OOPHORECTOMY;  Surgeon: Jonnie Kind, MD;  Location: [facility];  Service: Gynecology;  Laterality: Right;  . LT.EAR SURGERY      Family History  Problem Relation Age of Onset  . Diabetes Mother   . Hypertension Mother   . Hypertension Father   . Hypertension Sister   . Hypertension Paternal Grandmother   . Hypertension Sister   . Diabetes Sister   . Diverticulitis Other        Multiple family members  . Colon cancer Neg Hx  Social History   Socioeconomic History  . Marital status: Single    Spouse name: Not on file  . Number of children: Not on file  . Years of education: Not on file  . Highest education level: Not on file  Occupational History  . Not on file  Tobacco Use  . Smoking status: Never Smoker  . Smokeless tobacco: Never Used  . Tobacco comment: never used snuff or chewing tobacco.  Substance and Sexual Activity  . Alcohol use: No  . Drug use: No  . Sexual activity: Yes    Birth control/protection: Implant  Other Topics Concern  . Not on file  Social History Narrative  . Not on file   Social Determinants of Health   Financial Resource Strain:   . Difficulty of Paying  Living Expenses:   Food Insecurity:   . Worried About Charity fundraiser in the Last Year:   . Arboriculturist in the Last Year:   Transportation Needs:   . Film/video editor (Medical):   Marland Kitchen Lack of Transportation (Non-Medical):   Physical Activity:   . Days of Exercise per Week:   . Minutes of Exercise per Session:   Stress:   . Feeling of Stress :   Social Connections:   . Frequency of Communication with Friends and Family:   . Frequency of Social Gatherings with Friends and Family:   . Attends Religious Services:   . Active Member of Clubs or Organizations:   . Attends Archivist Meetings:   Marland Kitchen Marital Status:   Intimate Partner Violence:   . Fear of Current or Ex-Partner:   . Emotionally Abused:   Marland Kitchen Physically Abused:   . Sexually Abused:       ROS:  General: Negative for anorexia, weight loss, fever, chills, fatigue, weakness. Eyes: Negative for vision changes.  ENT: Negative for hoarseness, difficulty swallowing , nasal congestion. CV: Negative for chest pain, angina, palpitations, dyspnea on exertion, peripheral edema.  Respiratory: Negative for dyspnea at rest, dyspnea on exertion, cough, sputum, wheezing.  GI: See history of present illness. GU:  Negative for dysuria, hematuria, urinary incontinence, urinary frequency, nocturnal urination.  MS: Negative for joint pain, low back pain.  Derm: Negative for rash or itching.  Neuro: Negative for weakness, abnormal sensation, seizure, frequent headaches, memory loss, confusion.  Psych: Negative for anxiety, depression, suicidal ideation, hallucinations.  Endo: Negative for unusual weight change.  Heme: Negative for bruising or bleeding. Allergy: Negative for rash or hives.    Physical Examination:  BP 133/78   Pulse 63   Temp (!) 97.5 F (36.4 C) (Temporal)   Ht 5\' 10"  (1.778 m)   Wt (!) 311 lb 3.2 oz (141.2 kg)   BMI 44.65 kg/m    General: Well-nourished, well-developed in no acute distress.   Head: Normocephalic, atraumatic.   Eyes: Conjunctiva pink, no icterus. Mouth: masked Neck: Supple without thyromegaly, masses, or lymphadenopathy.  Lungs: Clear to auscultation bilaterally.  Heart: Regular rate and rhythm, no murmurs rubs or gallops.  Abdomen: Bowel sounds are normal, nontender, nondistended, no hepatosplenomegaly or masses, no abdominal bruits or    hernia , no rebound or guarding.  Exam limited due to body habitus. Rectal: not performed Extremities: No lower extremity edema. No clubbing or deformities.  Neuro: Alert and oriented x 4 , grossly normal neurologically.  Skin: Warm and dry, no rash or jaundice.   Psych: Alert and cooperative, normal mood and affect.  Labs: Lab  Results  Component Value Date   CREATININE 0.63 07/19/2019   BUN 15 07/19/2019   NA 137 07/19/2019   K 3.9 07/19/2019   CL 103 07/19/2019   CO2 26 07/19/2019    Lab Results  Component Value Date   WBC 10.8 (H) 07/19/2019   HGB 14.2 07/19/2019   HCT 43.8 07/19/2019   MCV 93.0 07/19/2019   PLT 265 07/19/2019     Imaging Studies: CT Renal Stone Study  Result Date: 07/19/2019 CLINICAL DATA:  Flank pain, right EXAM: CT ABDOMEN AND PELVIS WITHOUT CONTRAST TECHNIQUE: Multidetector CT imaging of the abdomen and pelvis was performed following the standard protocol without IV contrast. COMPARISON:  None. FINDINGS: LOWER CHEST: Normal. HEPATOBILIARY: Diffuse hypoattenuation of the liver relative to the spleen suggests hepatic steatosis. No focal liver lesion or biliary dilatation. There is cholelithiasis without acute inflammation. PANCREAS: Normal pancreas. No ductal dilatation or peripancreatic fluid collection. SPLEEN: Normal. ADRENALS/URINARY TRACT: The adrenal glands are normal. There is no hydronephrosis. No right nephroureterolithiasis. On the left, there is a 3 mm stone at the renal pelvis without evidence of obstruction. There is also a 5 mm lower pole stone. The urinary bladder is normal for  degree of distention STOMACH/BOWEL: There is no hiatal hernia. Normal duodenal course and caliber. No small bowel dilatation or inflammation. There is rectosigmoid diverticulosis with acute inflammation. Six no free intraperitoneal air or fluid collection. Normal appendix. VASCULAR/LYMPHATIC: Normal course and caliber of the major abdominal vessels. No abdominal or pelvic lymphadenopathy. REPRODUCTIVE: Normal uterus and ovaries. MUSCULOSKELETAL. No bony spinal canal stenosis or focal osseous abnormality. OTHER: None. IMPRESSION: 1. Acute sigmoid diverticulitis without abscess or free intraperitoneal air. 2. Nonobstructive left nephrolithiasis measuring up to 5 mm. 3. No right hydronephrosis or nephroureterolithiasis. 4. Hepatic steatosis and cholelithiasis without acute inflammation. Electronically Signed   By: Ulyses Jarred M.D.   On: 07/19/2019 16:22

## 2019-08-03 NOTE — Patient Instructions (Addendum)
1. Please go for labs as discussed. We will contact you with results as available.  2. Add FiberChoice chew two daily. Increase dietary fiber as well, see handout provided.  3. I will request copy of last colonoscopy. We will let you know if you will require updated colonoscopy once reviewed.  4. For fatty liver: Instructions for fatty liver: Recommend 1-2# weight loss per week until ideal body weight through exercise & diet. Low fat/cholesterol diet.   Avoid sweets, sodas, fruit juices, sweetened beverages like tea, etc. Gradually increase exercise from 15 min daily up to 1 hr per day 5 days/week. Limit alcohol use.   Fatty Liver Disease  Fatty liver disease occurs when too much fat has built up in your liver cells. Fatty liver disease is also called hepatic steatosis or steatohepatitis. The liver removes harmful substances from your bloodstream and produces fluids that your body needs. It also helps your body use and store energy from the food you eat. In many cases, fatty liver disease does not cause symptoms or problems. It is often diagnosed when tests are being done for other reasons. However, over time, fatty liver can cause inflammation that may lead to more serious liver problems, such as scarring of the liver (cirrhosis) and liver failure. Fatty liver is associated with insulin resistance, increased body fat, high blood pressure (hypertension), and high cholesterol. These are features of metabolic syndrome and increase your risk for stroke, diabetes, and heart disease. What are the causes? This condition may be caused by:  Drinking too much alcohol.  Poor nutrition.  Obesity.  Cushing's syndrome.  Diabetes.  High cholesterol.  Certain drugs.  Poisons.  Some viral infections.  Pregnancy. What increases the risk? You are more likely to develop this condition if you:  Abuse alcohol.  Are overweight.  Have diabetes.  Have hepatitis.  Have a high triglyceride  level.  Are pregnant. What are the signs or symptoms? Fatty liver disease often does not cause symptoms. If symptoms do develop, they can include:  Fatigue.  Weakness.  Weight loss.  Confusion.  Abdominal pain.  Nausea and vomiting.  Yellowing of your skin and the white parts of your eyes (jaundice).  Itchy skin. How is this diagnosed? This condition may be diagnosed by:  A physical exam and medical history.  Blood tests.  Imaging tests, such as an ultrasound, CT scan, or MRI.  A liver biopsy. A small sample of liver tissue is removed using a needle. The sample is then looked at under a microscope. How is this treated? Fatty liver disease is often caused by other health conditions. Treatment for fatty liver may involve medicines and lifestyle changes to manage conditions such as:  Alcoholism.  High cholesterol.  Diabetes.  Being overweight or obese. Follow these instructions at home:   Do not drink alcohol. If you have trouble quitting, ask your health care provider how to safely quit with the help of medicine or a supervised program. This is important to keep your condition from getting worse.  Eat a healthy diet as told by your health care provider. Ask your health care provider about working with a diet and nutrition specialist (dietitian) to develop an eating plan.  Exercise regularly. This can help you lose weight and control your cholesterol and diabetes. Talk to your health care provider about an exercise plan and which activities are best for you.  Take over-the-counter and prescription medicines only as told by your health care provider.  Keep all follow-up  visits as told by your health care provider. This is important. Contact a health care provider if: You have trouble controlling your:  Blood sugar. This is especially important if you have diabetes.  Cholesterol.  Drinking of alcohol. Get help right away if:  You have abdominal pain.  You  have jaundice.  You have nausea and vomiting.  You vomit blood or material that looks like coffee grounds.  You have stools that are black, tar-like, or bloody. Summary  Fatty liver disease develops when too much fat builds up in the cells of your liver.  Fatty liver disease often causes no symptoms or problems. However, over time, fatty liver can cause inflammation that may lead to more serious liver problems, such as scarring of the liver (cirrhosis).  You are more likely to develop this condition if you abuse alcohol, are pregnant, are overweight, have diabetes, have hepatitis, or have high triglyceride levels.  Contact your health care provider if you have trouble controlling your weight, blood sugar, cholesterol, or drinking of alcohol. This information is not intended to replace advice given to you by your health care provider. Make sure you discuss any questions you have with your health care provider. Document Revised: 01/28/2017 Document Reviewed: 11/24/2016 Elsevier Patient Education  2020 [place].   Nonalcoholic Fatty Liver Disease Diet, Adult Nonalcoholic fatty liver disease is a condition that causes fat to build up in and around the liver. The disease makes it harder for the liver to work the way that it should. Following a healthy diet can help to keep nonalcoholic fatty liver disease under control. It can also help to prevent or improve conditions that are associated with the disease, such as heart disease, diabetes, high blood pressure, and abnormal cholesterol levels. Along with regular exercise, this diet:  Promotes weight loss.  Helps to control blood sugar levels.  Helps to improve the way that the body uses insulin. What are tips for following this plan? Reading food labels Always check food labels for:  The amount of saturated fat in a food. You should limit your intake of saturated fat. Saturated fat is found in foods that come from animals, including  meat and dairy products such as butter, cheese, and whole milk.  The amount of fiber in a food. You should choose high-fiber foods such as fruits, vegetables, and whole grains. Try to get 25-30 grams (g) of fiber a day.  Cooking  When cooking, use heart-healthy oils that are high in monounsaturated fats. These include olive oil, canola oil, and avocado oil.  Limit frying or deep-frying foods. [place]ender foods using healthy methods such as baking, boiling, steaming, and grilling instead. Meal planning  You may want to keep track of how many calories you take in. Eating the right amount of calories will help you achieve a healthy weight. Meeting with a registered dietitian can help you get started.  Limit how often you eat takeout and fast food. These foods are usually very high in fat, salt, and sugar.  Use the glycemic index (GI) to plan your meals. The index tells you how quickly a food will raise your blood sugar. Choose low-GI foods (GI less than 55). These foods take a longer time to raise blood sugar. A registered dietitian can help you identify foods lower on the GI scale. Lifestyle  You may want to follow a Mediterranean diet. This diet includes a lot of vegetables, lean meats or fish, whole grains, fruits, and healthy oils and fats. What  foods can I eat?  Fruits Bananas. Apples. Oranges. Grapes. Papaya. Mango. Pomegranate. Kiwi. Grapefruit. Cherries. Vegetables Lettuce. Spinach. Peas. Beets. Cauliflower. Cabbage. Broccoli. Carrots. Tomatoes. Squash. Eggplant. Herbs. Peppers. Onions. Cucumbers. Brussels sprouts. Yams and sweet potatoes. Beans. Lentils. Grains Whole wheat or whole-grain foods, including breads, crackers, cereals, and pasta. Stone-ground whole wheat. Unsweetened oatmeal. Bulgur. Barley. Quinoa. Brown or wild rice. Corn or whole wheat flour tortillas. Meats and other proteins Lean meats. Poultry. Tofu. Seafood and shellfish. Dairy Low-fat or fat-free dairy products, such  as yogurt, cottage cheese, or cheese. Beverages Water. Sugar-free drinks. Tea. Coffee. Low-fat or skim milk. Milk alternatives, such as soy or almond milk. Real fruit juice. Fats and oils Avocado. Canola or olive oil. Nuts and nut butters. Seeds. Seasonings and condiments Mustard. Relish. Low-fat, low-sugar ketchup and barbecue sauce. Low-fat or fat-free mayonnaise. Sweets and desserts Sugar-free sweets. The items listed above may not be a complete list of foods and beverages you can eat. Contact a dietitian for more information. What foods should I limit or avoid? Meats and other proteins Limit red meat to 1-2 times a week. Dairy [employer]. Fats and oils Palm oil and coconut oil. Fried foods. Other foods Processed foods. Foods that contain a lot of salt or sodium. Sweets and desserts Sweets that contain sugar. Beverages Sweetened drinks, such as sweet tea, milkshakes, iced sweet drinks, and sodas. Alcohol. The items listed above may not be a complete list of foods and beverages you should avoid. Contact a dietitian for more information. Where to find more information The [employer] of Diabetes and Digestive and Kidney Diseases: [email or web address] Summary  Nonalcoholic fatty liver disease is a condition that causes fat to build up in and around the liver.  Following a healthy diet can help to keep nonalcoholic fatty liver disease under control. Your diet should be rich in fruits, vegetables, whole grains, and lean proteins.  Limit your intake of saturated fat. Saturated fat is found in foods that come from animals, including meat and dairy products such as butter, cheese, and whole milk.  This diet promotes weight loss, helps to control blood sugar levels, and helps to improve the way that the body uses insulin. This information is not intended to replace advice given to you by your health care provider. Make sure you discuss any questions you have with your health care  provider. Document Revised: 06/09/2018 Document Reviewed: 03/09/2018 Elsevier Patient Education  [place].   High-Fiber Diet Fiber, also called dietary fiber, is a type of carbohydrate that is found in fruits, vegetables, whole grains, and beans. A high-fiber diet can have many health benefits. Your health care provider may recommend a high-fiber diet to help:  Prevent constipation. Fiber can make your bowel movements more regular.  Lower your cholesterol.  Relieve the following conditions: ? Swelling of veins in the anus (hemorrhoids). ? Swelling and irritation (inflammation) of specific areas of the digestive tract (uncomplicated diverticulosis). ? A problem of the large intestine (colon) that sometimes causes pain and diarrhea (irritable bowel syndrome, IBS).  Prevent overeating as part of a weight-loss plan.  Prevent heart disease, type 2 diabetes, and certain cancers. What is my plan? The recommended daily fiber intake in grams (g) includes:  38 g for men age 17 or younger.  30 g for men over age 73.  2 g for women age 36 or younger.  21 g for women over age 30. You can get the recommended daily intake of dietary fiber by:  Eating a variety of fruits, vegetables, grains, and beans.  Taking a fiber supplement, if it is not possible to get enough fiber through your diet. What do I need to know about a high-fiber diet?  It is better to get fiber through food sources rather than from fiber supplements. There is not a lot of research about how effective supplements are.  Always check the fiber content on the nutrition facts label of any prepackaged food. Look for foods that contain 5 g of fiber or more per serving.  Talk with a diet and nutrition specialist (dietitian) if you have questions about specific foods that are recommended or not recommended for your medical condition, especially if those foods are not listed below.  Gradually increase how much fiber you  consume. If you increase your intake of dietary fiber too quickly, you may have bloating, cramping, or gas.  Drink plenty of water. Water helps you to digest fiber. What are tips for following this plan?  Eat a wide variety of high-fiber foods.  Make sure that half of the grains that you eat each day are whole grains.  Eat breads and cereals that are made with whole-grain flour instead of refined flour or white flour.  Eat brown rice, bulgur wheat, or millet instead of white rice.  Start the day with a breakfast that is high in fiber, such as a cereal that contains 5 g of fiber or more per serving.  Use beans in place of meat in soups, salads, and pasta dishes.  Eat high-fiber snacks, such as berries, raw vegetables, nuts, and popcorn.  Choose whole fruits and vegetables instead of processed forms like juice or sauce. What foods can I eat?  Fruits Berries. Pears. Apples. Oranges. Avocado. Prunes and raisins. Dried figs. Vegetables Sweet potatoes. Spinach. Kale. Artichokes. Cabbage. Broccoli. Cauliflower. Green peas. Carrots. Squash. Grains Whole-grain breads. Multigrain cereal. Oats and oatmeal. Brown rice. Barley. Bulgur wheat. Price. Quinoa. Bran muffins. Popcorn. Rye wafer crackers. Meats and other proteins Navy, kidney, and pinto beans. Soybeans. Split peas. Lentils. Nuts and seeds. Dairy Fiber-fortified yogurt. Beverages Fiber-fortified soy milk. Fiber-fortified orange juice. Other foods Fiber bars. The items listed above may not be a complete list of recommended foods and beverages. Contact a dietitian for more options. What foods are not recommended? Fruits Fruit juice. Cooked, strained fruit. Vegetables Fried potatoes. Canned vegetables. Well-cooked vegetables. Grains White bread. Pasta made with refined flour. White rice. Meats and other proteins Fatty cuts of meat. Fried chicken or fried fish. Dairy Milk. Yogurt. Cream cheese. Sour cream. Fats and  oils Butters. Beverages Soft drinks. Other foods Cakes and pastries. The items listed above may not be a complete list of foods and beverages to avoid. Contact a dietitian for more information. Summary  Fiber is a type of carbohydrate. It is found in fruits, vegetables, whole grains, and beans.  There are many health benefits of eating a high-fiber diet, such as preventing constipation, lowering blood cholesterol, helping with weight loss, and reducing your risk of heart disease, diabetes, and certain cancers.  Gradually increase your intake of fiber. Increasing too fast can result in cramping, bloating, and gas. Drink plenty of water while you increase your fiber.  The best sources of fiber include whole fruits and vegetables, whole grains, nuts, seeds, and beans. This information is not intended to replace advice given to you by your health care provider. Make sure you discuss any questions you have with your health care provider. Document Revised: 12/20/2016 Document  Reviewed: 12/20/2016 Elsevier Patient Education  [employer].

## 2019-08-04 LAB — HEPATIC FUNCTION PANEL
AG Ratio: 1.6 (calc) (ref 1.0–2.5)
ALT: 38 U/L — ABNORMAL HIGH (ref 6–29)
AST: 27 U/L (ref 10–30)
Albumin: 3.9 g/dL (ref 3.6–5.1)
Alkaline phosphatase (APISO): 48 U/L (ref 31–125)
Bilirubin, Direct: 0.1 mg/dL (ref 0.0–0.2)
Globulin: 2.5 g/dL (calc) (ref 1.9–3.7)
Indirect Bilirubin: 0.2 mg/dL (calc) (ref 0.2–1.2)
Total Bilirubin: 0.3 mg/dL (ref 0.2–1.2)
Total Protein: 6.4 g/dL (ref 6.1–8.1)

## 2019-08-05 ENCOUNTER — Encounter: Payer: Self-pay | Admitting: Gastroenterology

## 2019-08-05 NOTE — Assessment & Plan Note (Signed)
Noted on previous imaging. 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 No recent LFTS. Update labs.   Instructions for fatty liver: Recommend 1-2# weight loss per week until ideal body weight through exercise & diet. Low fat/cholesterol diet.   Avoid sweets, sodas, fruit juices, sweetened beverages like tea, etc. Gradually increase exercise from 15 min daily up to 1 hr per day 5 days/week. Limit alcohol use.

## 2019-08-05 NOTE — Assessment & Plan Note (Signed)
H/o diverticulitis in the past. She reports a couple of hospitalizations. Recent incidental finding when presented with suspected kidney stones. She has completed antibiotic therapy. She has had similar findings of sigmoid colon on prior noncontrast CTs in 2015 and 2018 as well. We will obtain copy of last colonoscopy for review. She may require updated colonoscopy. Will add Fiberchoice two daily. Increase dietary fiber as well.

## 2019-08-06 ENCOUNTER — Other Ambulatory Visit: Payer: Self-pay

## 2019-08-06 DIAGNOSIS — R7401 Elevation of levels of liver transaminase levels: Secondary | ICD-10-CM

## 2019-09-28 ENCOUNTER — Telehealth: Payer: Self-pay | Admitting: Gastroenterology

## 2019-09-28 NOTE — Telephone Encounter (Signed)
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  Please let patient know that we did receive a copy of her last colonoscopy which was in 2010.  Would recommend updating colonoscopy for abnormal sigmoid colon on CT and chronic intermittent diarrhea.  Schedule colonoscopy with Dr. Gala Romney with propofol. ASA III Day of prep and morning of procedure hold Metformin

## 2019-09-28 NOTE — Telephone Encounter (Signed)
Spoke with pt. Pt notified that [facility] reviewed her last TCS and results.   RGA-please arrange TCS with RMR- see [facility] note.

## 2019-09-28 NOTE — Telephone Encounter (Signed)
Will call pt to schedule TCS when September schedule is available.

## 2019-10-08 ENCOUNTER — Telehealth: Payer: Self-pay

## 2019-10-08 ENCOUNTER — Other Ambulatory Visit: Payer: Self-pay

## 2019-10-08 NOTE — Telephone Encounter (Signed)
Called pt, TCS w/Prop w/Dr. Gala Romney scheduled for 11/19/19 at 9:30am. Pt's Medicaid is now with [facility]. Member ID: [id number] L. Orders entered.   PA for TCS submitted via [facility] website. Notification/prior authorization reference# [id number]. Clinical notes uploaded to case.

## 2019-10-08 NOTE — Progress Notes (Unsigned)
bme

## 2019-10-09 ENCOUNTER — Other Ambulatory Visit: Payer: Self-pay

## 2019-10-09 NOTE — Telephone Encounter (Signed)
Received fax from [facility], [place] approved. Ref# [id number], valid 11/19/19-02/17/20.

## 2019-10-09 NOTE — Telephone Encounter (Signed)
Pre-op/covid test 11/16/19. Letter mailed with procedure instructions. 

## 2019-10-15 ENCOUNTER — Telehealth: Payer: Self-pay | Admitting: Internal Medicine

## 2019-10-15 NOTE — Telephone Encounter (Signed)
[phone] please call patient, she needs to reschedule her procedure because she forgot she had already planned a beach trip

## 2019-10-15 NOTE — Telephone Encounter (Signed)
Called pt, informed her we will call to reschedule TCS w/Prop w/Dr. Gala Romney when next schedule is available. Endo scheduler informed.

## 2019-11-07 ENCOUNTER — Encounter: Payer: Self-pay | Admitting: *Deleted

## 2019-11-07 NOTE — Telephone Encounter (Signed)
Called pt. She has been scheduled for 11/1 at 9:45am. Pt aware will mail new prep instructions with new pre-op/covid test apt. Confirmed mailing address.

## 2019-11-16 ENCOUNTER — Other Ambulatory Visit (HOSPITAL_COMMUNITY): Payer: Medicaid Other

## 2019-11-19 ENCOUNTER — Ambulatory Visit (HOSPITAL_COMMUNITY): Admit: 2019-11-19 | Payer: Medicaid Other | Admitting: Internal Medicine

## 2019-11-19 ENCOUNTER — Encounter (HOSPITAL_COMMUNITY): Payer: Self-pay

## 2019-11-19 SURGERY — COLONOSCOPY WITH PROPOFOL
Anesthesia: Monitor Anesthesia Care

## 2019-12-26 NOTE — Patient Instructions (Signed)
Sherian Valenza  12/26/2019     @PREFPERIOPPHARMACY @   Your procedure is scheduled on  12/31/2019.  Report to [facility] at  [place]  A.M.  Call this number if you have problems the morning of surgery:  [phone]   Remember:  Follow the diet and prep instructions given to you by the office.                      Take these medicines the morning of surgery with A SIP OF WATER  singulair.    Do not wear jewelry, make-up or nail polish.  Do not wear lotions, powders, or perfumes. Please wear deodorant and brush your teeth.  Do not shave 48 hours prior to surgery.  Men may shave face and neck.  Do not bring valuables to the hospital.  [facility] is not responsible for any belongings or valuables.  Contacts, dentures or bridgework may not be worn into surgery.  Leave your suitcase in the car.  After surgery it may be brought to your room.  For patients admitted to the hospital, discharge time will be determined by your treatment team.  Patients discharged the day of surgery will not be allowed to drive home.   Name and phone number of your driver:   family Special instructions:  DO NOT smoke the morning of your procedure.  Please read over the following fact sheets that you were given. Anesthesia Post-op Instructions and Care and Recovery After Surgery       Colonoscopy, Adult, Care After This sheet gives you information about how to care for yourself after your procedure. Your health care provider may also give you more specific instructions. If you have problems or questions, contact your health care provider. What can I expect after the procedure? After the procedure, it is common to have:  A small amount of blood in your stool for 24 hours after the procedure.  Some gas.  Mild cramping or bloating of your abdomen. Follow these instructions at home: Eating and drinking   Drink enough fluid to keep your urine pale yellow.  Follow instructions from your  health care provider about eating or drinking restrictions.  Resume your normal diet as instructed by your health care provider. Avoid heavy or fried foods that are hard to digest. Activity  Rest as told by your health care provider.  Avoid sitting for a long time without moving. Get up to take short walks every 1-2 hours. This is important to improve blood flow and breathing. Ask for help if you feel weak or unsteady.  Return to your normal activities as told by your health care provider. Ask your health care provider what activities are safe for you. Managing cramping and bloating   Try walking around when you have cramps or feel bloated.  Apply heat to your abdomen as told by your health care provider. Use the heat source that your health care provider recommends, such as a moist heat pack or a heating pad. ? Place a towel between your skin and the heat source. ? Leave the heat on for 20-30 minutes. ? Remove the heat if your skin turns bright red. This is especially important if you are unable to feel pain, heat, or cold. You may have a greater risk of getting burned. General instructions  For the first 24 hours after the procedure: ? Do not drive or use machinery. ? Do not sign important documents. ?  Do not drink alcohol. ? Do your regular daily activities at a slower pace than normal. ? Eat soft foods that are easy to digest.  Take over-the-counter and prescription medicines only as told by your health care provider.  Keep all follow-up visits as told by your health care provider. This is important. Contact a health care provider if:  You have blood in your stool 2-3 days after the procedure. Get help right away if you have:  More than a small spotting of blood in your stool.  Large blood clots in your stool.  Swelling of your abdomen.  Nausea or vomiting.  A fever.  Increasing pain in your abdomen that is not relieved with medicine. Summary  After the procedure,  it is common to have a small amount of blood in your stool. You may also have mild cramping and bloating of your abdomen.  For the first 24 hours after the procedure, do not drive or use machinery, sign important documents, or drink alcohol.  Get help right away if you have a lot of blood in your stool, nausea or vomiting, a fever, or increased pain in your abdomen. This information is not intended to replace advice given to you by your health care provider. Make sure you discuss any questions you have with your health care provider. Document Revised: 09/11/2018 Document Reviewed: 09/11/2018 Elsevier Patient Education  [place] After These instructions provide you with information about caring for yourself after your procedure. Your health care provider may also give you more specific instructions. Your treatment has been planned according to current medical practices, but problems sometimes occur. Call your health care provider if you have any problems or questions after your procedure. What can I expect after the procedure? After your procedure, you may:  Feel sleepy for several hours.  Feel clumsy and have poor balance for several hours.  Feel forgetful about what happened after the procedure.  Have poor judgment for several hours.  Feel nauseous or vomit.  Have a sore throat if you had a breathing tube during the procedure. Follow these instructions at home: For at least 24 hours after the procedure:      Have a responsible adult stay with you. It is important to have someone help care for you until you are awake and alert.  Rest as needed.  Do not: ? Participate in activities in which you could fall or become injured. ? Drive. ? Use heavy machinery. ? Drink alcohol. ? Take sleeping pills or medicines that cause drowsiness. ? Make important decisions or sign legal documents. ? Take care of children on your own. Eating and  drinking  Follow the diet that is recommended by your health care provider.  If you vomit, drink water, juice, or soup when you can drink without vomiting.  Make sure you have little or no nausea before eating solid foods. General instructions  Take over-the-counter and prescription medicines only as told by your health care provider.  If you have sleep apnea, surgery and certain medicines can increase your risk for breathing problems. Follow instructions from your health care provider about wearing your sleep device: ? Anytime you are sleeping, including during daytime naps. ? While taking prescription pain medicines, sleeping medicines, or medicines that make you drowsy.  If you smoke, do not smoke without supervision.  Keep all follow-up visits as told by your health care provider. This is important. Contact a health care provider if:  You keep  feeling nauseous or you keep vomiting.  You feel light-headed.  You develop a rash.  You have a fever. Get help right away if:  You have trouble breathing. Summary  For several hours after your procedure, you may feel sleepy and have poor judgment.  Have a responsible adult stay with you for at least 24 hours or until you are awake and alert. This information is not intended to replace advice given to you by your health care provider. Make sure you discuss any questions you have with your health care provider. Document Revised: 05/16/2017 Document Reviewed: 06/08/2015 Elsevier Patient Education  [place].

## 2019-12-28 ENCOUNTER — Encounter (HOSPITAL_COMMUNITY)
Admission: RE | Admit: 2019-12-28 | Discharge: 2019-12-28 | Disposition: A | Discharge status: Home / Self Care | Payer: Medicaid Other | Source: Ambulatory Visit | Attending: Internal Medicine | Admitting: Internal Medicine

## 2019-12-28 ENCOUNTER — Encounter (HOSPITAL_COMMUNITY): Payer: Self-pay

## 2019-12-28 ENCOUNTER — Other Ambulatory Visit: Payer: Self-pay

## 2019-12-28 ENCOUNTER — Other Ambulatory Visit (HOSPITAL_COMMUNITY)
Admission: RE | Admit: 2019-12-28 | Discharge: 2019-12-28 | Disposition: A | Discharge status: Home / Self Care | Payer: Medicaid Other | Source: Ambulatory Visit | Attending: Internal Medicine | Admitting: Internal Medicine

## 2019-12-28 DIAGNOSIS — Z01812 Encounter for preprocedural laboratory examination: Secondary | ICD-10-CM | POA: Diagnosis present

## 2019-12-28 DIAGNOSIS — Z20822 Contact with and (suspected) exposure to covid-19: Secondary | ICD-10-CM | POA: Insufficient documentation

## 2019-12-28 LAB — BASIC METABOLIC PANEL
Anion gap: 9 (ref 5–15)
BUN: 15 mg/dL (ref 6–20)
CO2: 24 mmol/L (ref 22–32)
Calcium: 9 mg/dL (ref 8.9–10.3)
Chloride: 102 mmol/L (ref 98–111)
Creatinine, Ser: 0.7 mg/dL (ref 0.44–1.00)
GFR, Estimated: >60 mL/min (ref >60)
Glucose, Bld: 151 mg/dL — ABNORMAL HIGH (ref 70–99)
Potassium: 3.7 mmol/L (ref 3.5–5.1)
Sodium: 135 mmol/L (ref 135–145)

## 2019-12-28 LAB — HCG, SERUM, QUALITATIVE: Preg, Serum: NEGATIVE

## 2019-12-29 LAB — SARS CORONAVIRUS 2 (TAT 6-24 HRS): SARS Coronavirus 2: NEGATIVE

## 2019-12-31 ENCOUNTER — Ambulatory Visit (HOSPITAL_COMMUNITY): Payer: Medicaid Other | Admitting: Anesthesiology

## 2019-12-31 ENCOUNTER — Encounter (HOSPITAL_COMMUNITY): Payer: Self-pay | Admitting: Internal Medicine

## 2019-12-31 ENCOUNTER — Encounter (HOSPITAL_COMMUNITY)
Admission: RE | Disposition: A | Discharge status: Home / Self Care | Payer: Self-pay | Source: Home / Self Care | Attending: Internal Medicine

## 2019-12-31 ENCOUNTER — Ambulatory Visit (HOSPITAL_COMMUNITY)
Admission: RE | Admit: 2019-12-31 | Discharge: 2019-12-31 | Disposition: A | Discharge status: Home / Self Care | Payer: Medicaid Other | Attending: Internal Medicine | Admitting: Internal Medicine

## 2019-12-31 DIAGNOSIS — K5730 Diverticulosis of large intestine without perforation or abscess without bleeding: Secondary | ICD-10-CM

## 2019-12-31 DIAGNOSIS — R933 Abnormal findings on diagnostic imaging of other parts of digestive tract: Secondary | ICD-10-CM

## 2019-12-31 HISTORY — PX: COLONOSCOPY WITH PROPOFOL: SHX5780

## 2019-12-31 LAB — GLUCOSE, CAPILLARY
Glucose-Capillary: 144 mg/dL — ABNORMAL HIGH (ref 70–99)
Glucose-Capillary: 114 mg/dL — ABNORMAL HIGH (ref 70–99)

## 2019-12-31 SURGERY — COLONOSCOPY WITH PROPOFOL
Anesthesia: General

## 2019-12-31 MED ORDER — LIDOCAINE HCL (CARDIAC) PF 100 MG/5ML IV SOSY
PREFILLED_SYRINGE | INTRAVENOUS | Status: DC | PRN
  Administered 2019-12-31: 50 mg via INTRAVENOUS

## 2019-12-31 MED ORDER — PROPOFOL 500 MG/50ML IV EMUL
INTRAVENOUS | Status: DC | PRN
  Administered 2019-12-31: 100 ug/kg/min via INTRAVENOUS

## 2019-12-31 MED ORDER — CHLORHEXIDINE GLUCONATE CLOTH 2 % EX PADS: 6.0000 | MEDICATED_PAD | Freq: Once | CUTANEOUS | Status: DC

## 2019-12-31 MED ORDER — LACTATED RINGERS IV SOLN
INTRAVENOUS | Status: DC
  Administered 2019-12-31: 1000 mL via INTRAVENOUS

## 2019-12-31 MED ORDER — LACTATED RINGERS IV SOLN: INTRAVENOUS | Status: DC | PRN

## 2019-12-31 MED ORDER — STERILE WATER FOR IRRIGATION IR SOLN
Status: DC | PRN
  Administered 2019-12-31: 100 mL

## 2019-12-31 MED ORDER — PROPOFOL 10 MG/ML IV BOLUS
INTRAVENOUS | Status: DC | PRN
  Administered 2019-12-31: 100 mg via INTRAVENOUS

## 2019-12-31 NOTE — Anesthesia Preprocedure Evaluation (Signed)
Anesthesia Evaluation  Patient identified by MRN, date of birth, ID band Patient awake    Reviewed: Allergy & Precautions, H&P , NPO status , Patient's Chart, lab work & pertinent test results, reviewed documented beta blocker date and time   Airway Mallampati: II  TM Distance: >3 FB Neck ROM: full    Dental no notable dental hx.    Pulmonary sleep apnea ,    Pulmonary exam normal breath sounds clear to auscultation       Cardiovascular Exercise Tolerance: Good negative cardio ROS   Rhythm:regular Rate:Normal     Neuro/Psych negative neurological ROS  negative psych ROS   GI/Hepatic negative GI ROS, Neg liver ROS,   Endo/Other  diabetes, Type 2Morbid obesity  Renal/GU negative Renal ROS  negative genitourinary   Musculoskeletal   Abdominal   Peds  Hematology negative hematology ROS (+)   Anesthesia Other Findings   Reproductive/Obstetrics negative OB ROS                             Anesthesia Physical Anesthesia Plan  ASA: III  Anesthesia Plan: General   Post-op Pain Management:    Induction:   PONV Risk Score and Plan: Propofol infusion  Airway Management Planned:   Additional Equipment:   Intra-op Plan:   Post-operative Plan:   Informed Consent: I have reviewed the patients History and Physical, chart, labs and discussed the procedure including the risks, benefits and alternatives for the proposed anesthesia with the patient or authorized representative who has indicated his/her understanding and acceptance.     Dental Advisory Given  Plan Discussed with: CRNA  Anesthesia Plan Comments:         Anesthesia Quick Evaluation

## 2019-12-31 NOTE — Anesthesia Postprocedure Evaluation (Signed)
Anesthesia Post Note  Patient: Tiffany Ward  Procedure(s) Performed: COLONOSCOPY WITH PROPOFOL (N/A )  Patient location during evaluation: Phase II Anesthesia Type: General Level of consciousness: awake and alert and oriented Pain management: pain level controlled Vital Signs Assessment: post-procedure vital signs reviewed and stable Respiratory status: spontaneous breathing, nonlabored ventilation and respiratory function stable Cardiovascular status: blood pressure returned to baseline Postop Assessment: no apparent nausea or vomiting Anesthetic complications: no   No complications documented.   Last Vitals:  Vitals:   12/31/19 1000 12/31/19 1002  BP: 130/77 130/77  Pulse: (!) 59 64  Resp: (!) 22 19  Temp:  36.4 C  SpO2: 100% 100%    Last Pain:  Vitals:   12/31/19 1002  TempSrc: Oral  PainSc: 0-No pain                 Orlie Dakin

## 2019-12-31 NOTE — Discharge Instructions (Signed)
Colonoscopy Discharge Instructions  Read the instructions outlined below and refer to this sheet in the next few weeks. These discharge instructions provide you with general information on caring for yourself after you leave the hospital. Your doctor may also give you specific instructions. While your treatment has been planned according to the most current medical practices available, unavoidable complications occasionally occur. If you have any problems or questions after discharge, call Dr. Gala Romney at [phone]. ACTIVITY  You may resume your regular activity, but move at a slower pace for the next 24 hours.   Take frequent rest periods for the next 24 hours.   Walking will help get rid of the air and reduce the bloated feeling in your belly (abdomen).   No driving for 24 hours (because of the medicine (anesthesia) used during the test).    Do not sign any important legal documents or operate any machinery for 24 hours (because of the anesthesia used during the test).  NUTRITION  Drink plenty of fluids.   You may resume your normal diet as instructed by your doctor.   Begin with a light meal and progress to your normal diet. Heavy or fried foods are harder to digest and may make you feel sick to your stomach (nauseated).   Avoid alcoholic beverages for 24 hours or as instructed.  MEDICATIONS  You may resume your normal medications unless your doctor tells you otherwise.  WHAT YOU CAN EXPECT TODAY  Some feelings of bloating in the abdomen.   Passage of more gas than usual.   Spotting of blood in your stool or on the toilet paper.  IF YOU HAD POLYPS REMOVED DURING THE COLONOSCOPY:  No aspirin products for 7 days or as instructed.   No alcohol for 7 days or as instructed.   Eat a soft diet for the next 24 hours.  FINDING OUT THE RESULTS OF YOUR TEST Not all test results are available during your visit. If your test results are not back during the visit, make an appointment  with your caregiver to find out the results. Do not assume everything is normal if you have not heard from your caregiver or the medical facility. It is important for you to follow up on all of your test results.  SEEK IMMEDIATE MEDICAL ATTENTION IF:  You have more than a spotting of blood in your stool.   Your belly is swollen (abdominal distention).   You are nauseated or vomiting.   You have a temperature over 101.   You have abdominal pain or discomfort that is severe or gets worse throughout the day.   You have extensive diverticulosis throughout your colon but nothing else  Informational diverticulosis provided  Office follow-up with Neil Crouch in 6 weeks  Patient request, I called Deola Rewis at [phone] -reviewed findings     Diverticulosis  Diverticulosis is a condition that develops when small pouches (diverticula) form in the wall of the large intestine (colon). The colon is where water is absorbed and stool (feces) is formed. The pouches form when the inside layer of the colon pushes through weak spots in the outer layers of the colon. You may have a few pouches or many of them. The pouches usually do not cause problems unless they become inflamed or infected. When this happens, the condition is called diverticulitis. What are the causes? The cause of this condition is not known. What increases the risk? The following factors may make you more likely to develop this condition:  Being older than age 55. Your risk for this condition increases with age. Diverticulosis is rare among people younger than age 41. By age 29, many people have it.  Eating a low-fiber diet.  Having frequent constipation.  Being overweight.  Not getting enough exercise.  Smoking.  Taking over-the-counter pain medicines, like aspirin and ibuprofen.  Having a family history of diverticulosis. What are the signs or symptoms? In most people, there are no symptoms of this condition.  If you do have symptoms, they may include:  Bloating.  Cramps in the abdomen.  Constipation or diarrhea.  Pain in the lower left side of the abdomen. How is this diagnosed? Because diverticulosis usually has no symptoms, it is most often diagnosed during an exam for other colon problems. The condition may be diagnosed by:  Using a flexible scope to examine the colon (colonoscopy).  Taking an X-ray of the colon after dye has been put into the colon (barium enema).  Having a CT scan. How is this treated? You may not need treatment for this condition. Your health care provider may recommend treatment to prevent problems. You may need treatment if you have symptoms or if you previously had diverticulitis. Treatment may include:  Eating a high-fiber diet.  Taking a fiber supplement.  Taking a live bacteria supplement (probiotic).  Taking medicine to relax your colon. Follow these instructions at home: Medicines  Take over-the-counter and prescription medicines only as told by your health care provider.  If told by your health care provider, take a fiber supplement or probiotic. Constipation prevention Your condition may cause constipation. To prevent or treat constipation, you may need to:  Drink enough fluid to keep your urine pale yellow.  Take over-the-counter or prescription medicines.  Eat foods that are high in fiber, such as beans, whole grains, and fresh fruits and vegetables.  Limit foods that are high in fat and processed sugars, such as fried or sweet foods.  General instructions  Try not to strain when you have a bowel movement.  Keep all follow-up visits as told by your health care provider. This is important. Contact a health care provider if you:  Have pain in your abdomen.  Have bloating.  Have cramps.  Have not had a bowel movement in 3 days. Get help right away if:  Your pain gets worse.  Your bloating becomes very bad.  You have a fever or  chills, and your symptoms suddenly get worse.  You vomit.  You have bowel movements that are bloody or black.  You have bleeding from your rectum. Summary  Diverticulosis is a condition that develops when small pouches (diverticula) form in the wall of the large intestine (colon).  You may have a few pouches or many of them.  This condition is most often diagnosed during an exam for other colon problems.  Treatment may include increasing the fiber in your diet, taking supplements, or taking medicines. This information is not intended to replace advice given to you by your health care provider. Make sure you discuss any questions you have with your health care provider. Document Revised: 09/14/2018 Document Reviewed: 09/14/2018 Elsevier Patient Education  [place] After These instructions provide you with information about caring for yourself after your procedure. Your health care provider may also give you more specific instructions. Your treatment has been planned according to current medical practices, but problems sometimes occur. Call your health care provider if you have any  problems or questions after your procedure. What can I expect after the procedure? After your procedure, you may:  Feel sleepy for several hours.  Feel clumsy and have poor balance for several hours.  Feel forgetful about what happened after the procedure.  Have poor judgment for several hours.  Feel nauseous or vomit.  Have a sore throat if you had a breathing tube during the procedure. Follow these instructions at home: For at least 24 hours after the procedure:      Have a responsible adult stay with you. It is important to have someone help care for you until you are awake and alert.  Rest as needed.  Do not: ? Participate in activities in which you could fall or become injured. ? Drive. ? Use heavy machinery. ? Drink alcohol. ? Take  sleeping pills or medicines that cause drowsiness. ? Make important decisions or sign legal documents. ? Take care of children on your own. Eating and drinking  Follow the diet that is recommended by your health care provider.  If you vomit, drink water, juice, or soup when you can drink without vomiting.  Make sure you have little or no nausea before eating solid foods. General instructions  Take over-the-counter and prescription medicines only as told by your health care provider.  If you have sleep apnea, surgery and certain medicines can increase your risk for breathing problems. Follow instructions from your health care provider about wearing your sleep device: ? Anytime you are sleeping, including during daytime naps. ? While taking prescription pain medicines, sleeping medicines, or medicines that make you drowsy.  If you smoke, do not smoke without supervision.  Keep all follow-up visits as told by your health care provider. This is important. Contact a health care provider if:  You keep feeling nauseous or you keep vomiting.  You feel light-headed.  You develop a rash.  You have a fever. Get help right away if:  You have trouble breathing. Summary  For several hours after your procedure, you may feel sleepy and have poor judgment.  Have a responsible adult stay with you for at least 24 hours or until you are awake and alert. This information is not intended to replace advice given to you by your health care provider. Make sure you discuss any questions you have with your health care provider. Document Revised: 05/16/2017 Document Reviewed: 06/08/2015 Elsevier Patient Education  [place].

## 2019-12-31 NOTE — H&P (Signed)
@LOGO @   Primary Care Physician:  [facility] Personal Primary Gastroenterologist:  Dr. Gala Romney  Pre-Procedure History & Physical: HPI:  Tiffany Ward is a 34 y.o. female here for evaluation of recurrent diverticulitis via colonoscopy.  Past Medical History:  Diagnosis Date  . Deafness in left ear   . Diabetes mellitus without complication ([place])   . Diverticulitis   . Sleep apnea    does not use cpap    Past Surgical History:  Procedure Laterality Date  . CESAREAN SECTION    . LAPAROSCOPIC SALPINGO OOPHERECTOMY Right 10/09/2013   Procedure: LAPAROSCOPIC RIGHT OOPHORECTOMY;  Surgeon: Jonnie Kind, MD;  Location: [facility];  Service: Gynecology;  Laterality: Right;  . LT.EAR SURGERY      Prior to Admission medications   Medication Sig Start Date End Date Taking? Authorizing Provider  atorvastatin (LIPITOR) 10 MG tablet Take 10 mg by mouth at bedtime. 07/03/19  Yes [provider]  Cholecalciferol (VITAMIN D3) 1.25 MG (50000 UT) CAPS Take 5,000 Units by mouth every 7 (seven) days.  07/03/19  Yes [provider]  etonogestrel (NEXPLANON) 68 MG IMPL implant 68 mg by Subdermal route once.    Yes [provider]  metFORMIN (GLUCOPHAGE-XR) 500 MG 24 hr tablet Take 500 mg by mouth at bedtime. 07/04/19  Yes [provider]  montelukast (SINGULAIR) 10 MG tablet Take 10 mg by mouth at bedtime.  07/03/19  Yes [provider]    Allergies as of 11/07/2019 - Review Complete 08/03/2019  Allergen Reaction Noted  . Codeine Itching and Rash 08/15/2013    Family History  Problem Relation Age of Onset  . Diabetes Mother   . Hypertension Mother   . Hypertension Father   . Hypertension Sister   . Hypertension Paternal Grandmother   . Hypertension Sister   . Diabetes Sister   . Diverticulitis Other        Multiple family members  . Colon cancer Neg Hx     Social History   Socioeconomic History  . Marital status: Single     Spouse name: Not on file  . Number of children: Not on file  . Years of education: Not on file  . Highest education level: Not on file  Occupational History  . Not on file  Tobacco Use  . Smoking status: Never Smoker  . Smokeless tobacco: Never Used  . Tobacco comment: never used snuff or chewing tobacco.  Substance and Sexual Activity  . Alcohol use: No  . Drug use: No  . Sexual activity: Yes    Birth control/protection: Implant  Other Topics Concern  . Not on file  Social History Narrative  . Not on file   Social Determinants of Health   Financial Resource Strain:   . Difficulty of Paying Living Expenses: Not on file  Food Insecurity:   . Worried About Charity fundraiser in the Last Year: Not on file  . Ran Out of Food in the Last Year: Not on file  Transportation Needs:   . Lack of Transportation (Medical): Not on file  . Lack of Transportation (Non-Medical): Not on file  Physical Activity:   . Days of Exercise per Week: Not on file  . Minutes of Exercise per Session: Not on file  Stress:   . Feeling of Stress : Not on file  Social Connections:   . Frequency of Communication with Friends and Family: Not on file  . Frequency of Social Gatherings with  Friends and Family: Not on file  . Attends Religious Services: Not on file  . Active Member of Clubs or Organizations: Not on file  . Attends Archivist Meetings: Not on file  . Marital Status: Not on file  Intimate Partner Violence:   . Fear of Current or Ex-Partner: Not on file  . Emotionally Abused: Not on file  . Physically Abused: Not on file  . Sexually Abused: Not on file    Review of Systems: See HPI, otherwise negative ROS  Physical Exam: BP 131/66   Pulse 71   Temp 98.1 F (36.7 C) (Oral)   Resp 19   Ht 5\' 9"  (1.753 m)   Wt 134.3 kg   SpO2 99%   BMI 43.71 kg/m  General:   Alert,  Well-developed, well-nourished, pleasant and cooperative in NAD Neck:  Supple; no masses or thyromegaly.  No significant cervical adenopathy. Lungs:  Clear throughout to auscultation.   No wheezes, crackles, or rhonchi. No acute distress. Heart:  Regular rate and rhythm; no murmurs, clicks, rubs,  or gallops. Abdomen: Non-distended, normal bowel sounds.  Soft and nontender without appreciable mass or hepatosplenomegaly.  Pulses:  Normal pulses noted. Extremities:  Without clubbing or edema.  Impression/Plan: 34 year old lady with a reported history recurrent bouts of diverticulitis.  Here for diagnostic colonoscopy.  The risks, benefits, limitations, alternatives and imponderables have been reviewed with the patient. Questions have been answered. All parties are agreeable.      Notice: This dictation was prepared with Dragon dictation along with smaller phrase technology. Any transcriptional errors that result from this process are unintentional and may not be corrected upon review.

## 2019-12-31 NOTE — Transfer of Care (Signed)
Immediate Anesthesia Transfer of Care Note  Patient: Tiffany Ward  Procedure(s) Performed: COLONOSCOPY WITH PROPOFOL (N/A )  Patient Location: PACU  Anesthesia Type:General  Level of Consciousness: awake, alert  and oriented  Airway & Oxygen Therapy: Patient Spontanous Breathing  Post-op Assessment: Report given to RN, Post -op Vital signs reviewed and stable and Patient moving all extremities X 4  Post vital signs: Reviewed and stable  Last Vitals:  Vitals Value Taken Time  BP 131/67 12/31/19 0939  Temp    Pulse 70 12/31/19 0940  Resp 17 12/31/19 0940  SpO2 100 % 12/31/19 0940  Vitals shown include unvalidated device data.  Last Pain:  Vitals:   12/31/19 0920  TempSrc:   PainSc: 0-No pain         Complications: No complications documented.

## 2019-12-31 NOTE — Op Note (Signed)
[facility] Patient Name: Tiffany Ward Procedure Date: 12/31/2019 9:05 AM MRN: [id number] Date of Birth: 06-Nov-1985 Attending MD: Norvel Richards , MD CSN: [id number] Age: 34 Admit Type: Inpatient Procedure:                Colonoscopy Indications:              Abnormal CT of the GI tract Providers:                Norvel Richards, MD, Crystal Page, Aram Candela Referring MD:              Medicines:                Propofol per Anesthesia Complications:            No immediate complications. Estimated Blood Loss:     Estimated blood loss: none. Procedure:                Pre-Anesthesia Assessment:                           - Prior to the procedure, a History and Physical                            was performed, and patient medications and                            allergies were reviewed. The patient's tolerance of                            previous anesthesia was also reviewed. The risks                            and benefits of the procedure and the sedation                            options and risks were discussed with the patient.                            All questions were answered, and informed consent                            was obtained. Prior Anticoagulants: The patient has                            taken no previous anticoagulant or antiplatelet                            agents. ASA Grade Assessment: III - A patient with                            severe systemic disease. After reviewing the risks                            and benefits, the patient was deemed in  satisfactory condition to undergo the procedure.                           After obtaining informed consent, the colonoscope                            was passed under direct vision. Throughout the                            procedure, the patient's blood pressure, pulse, and                            oxygen saturations were monitored continuously. The                             [id number]-[id number] ([id number]) scope was introduced through                            the anus and advanced to the the cecum, identified                            by appendiceal orifice and ileocecal valve. The                            colonoscopy was performed without difficulty. The                            patient tolerated the procedure well. The quality                            of the bowel preparation was adequate. The                            ileocecal valve, appendiceal orifice, and rectum                            were photographed. The entire colon was well                            visualized. Scope In: 9:26:05 AM Scope Out: 9:35:54 AM Scope Withdrawal Time: 0 hours 6 minutes 16 seconds  Total Procedure Duration: 0 hours 9 minutes 49 seconds  Findings:      The perianal and digital rectal examinations were normal.      Multiple small and large-mouthed diverticula were found in the entire       colon.      The exam was otherwise without abnormality on direct and retroflexion       views. Impression:               - Diverticulosis in the entire examined colon.                           - The examination was otherwise normal on direct  and retroflexion views.                           - No specimens collected. Moderate Sedation:      Moderate (conscious) sedation was personally administered by an       anesthesia professional. The following parameters were monitored: oxygen       saturation, heart rate, blood pressure, respiratory rate, EKG, adequacy       of pulmonary ventilation, and response to care. Recommendation:           - Patient has a contact number available for                            emergencies. The signs and symptoms of potential                            delayed complications were discussed with the                            patient. Return to normal activities tomorrow.                            Written  discharge instructions were provided to the                            patient.                           - Resume previous diet.                           - Continue present medications. Information on                            diverticulosis provided office visit with Neil Crouch in 6 weeks                           - Repeat colonoscopy at age 21 for screening                            purposes Procedure Code(s):        --- Professional ---                           [phone], Colonoscopy, flexible; diagnostic, including                            collection of specimen(s) by brushing or washing,                            when performed (separate procedure) Diagnosis Code(s):        --- Professional ---  K57.30, Diverticulosis of large intestine without                            perforation or abscess without bleeding                           R93.3, Abnormal findings on diagnostic imaging of                            other parts of digestive tract CPT copyright 2019 American Medical Association. All rights reserved. The codes documented in this report are preliminary and upon coder review may  be revised to meet current compliance requirements. Cristopher Estimable. Hayato Guaman, MD Norvel Richards, MD 12/31/2019 10:41:06 AM This report has been signed electronically. Number of Addenda: 0

## 2019-12-31 NOTE — Anesthesia Procedure Notes (Signed)
Date/Time: 12/31/2019 9:28 AM Performed by: Orlie Dakin, CRNA Pre-anesthesia Checklist: Patient identified, Emergency Drugs available, Suction available and Patient being monitored Oxygen Delivery Method: Non-rebreather mask Induction Type: IV induction Placement Confirmation: positive ETCO2

## 2020-01-04 ENCOUNTER — Encounter (HOSPITAL_COMMUNITY): Payer: Self-pay | Admitting: Internal Medicine

## 2020-01-17 ENCOUNTER — Other Ambulatory Visit: Payer: Self-pay

## 2020-01-17 DIAGNOSIS — R7401 Elevation of levels of liver transaminase levels: Secondary | ICD-10-CM

## 2020-02-05 ENCOUNTER — Other Ambulatory Visit: Payer: Self-pay

## 2020-02-05 ENCOUNTER — Encounter: Payer: Self-pay | Admitting: Gastroenterology

## 2020-02-05 ENCOUNTER — Ambulatory Visit: Payer: Medicaid Other | Admitting: Gastroenterology

## 2020-02-05 VITALS — BP 128/78 | HR 61 | Temp 97.0°F | Ht 70.0 in | Wt 302.0 lb

## 2020-02-05 DIAGNOSIS — K760 Fatty (change of) liver, not elsewhere classified: Secondary | ICD-10-CM | POA: Diagnosis not present

## 2020-02-05 DIAGNOSIS — K5792 Diverticulitis of intestine, part unspecified, without perforation or abscess without bleeding: Secondary | ICD-10-CM

## 2020-02-05 NOTE — Progress Notes (Signed)
Cc'ed to pcp °

## 2020-02-05 NOTE — Patient Instructions (Addendum)
We will check labs you had done from the health department recently and let you know if liver numbers were done. If not, you can take the order provided and have them done.  Add a fiber supplement daily. Benefiber or FiberChoice are good options.   For fatty liver, continue to try and control your diabetes and cholesterol. Work on slow gradual weight loss. Try walking 30 minutes daily at least 5 days per week.  Return to the office in on year.   Fatty Liver Disease  Fatty liver disease occurs when too much fat has built up in your liver cells. Fatty liver disease is also called hepatic steatosis or steatohepatitis. The liver removes harmful substances from your bloodstream and produces fluids that your body needs. It also helps your body use and store energy from the food you eat. In many cases, fatty liver disease does not cause symptoms or problems. It is often diagnosed when tests are being done for other reasons. However, over time, fatty liver can cause inflammation that may lead to more serious liver problems, such as scarring of the liver (cirrhosis) and liver failure. Fatty liver is associated with insulin resistance, increased body fat, high blood pressure (hypertension), and high cholesterol. These are features of metabolic syndrome and increase your risk for stroke, diabetes, and heart disease. What are the causes? This condition may be caused by:  Drinking too much alcohol.  Poor nutrition.  Obesity.  Cushing's syndrome.  Diabetes.  High cholesterol.  Certain drugs.  Poisons.  Some viral infections.  Pregnancy. What increases the risk? You are more likely to develop this condition if you:  Abuse alcohol.  Are overweight.  Have diabetes.  Have hepatitis.  Have a high triglyceride level.  Are pregnant. What are the signs or symptoms? Fatty liver disease often does not cause symptoms. If symptoms do develop, they can  include:  Fatigue.  Weakness.  Weight loss.  Confusion.  Abdominal pain.  Nausea and vomiting.  Yellowing of your skin and the white parts of your eyes (jaundice).  Itchy skin. How is this diagnosed? This condition may be diagnosed by:  A physical exam and medical history.  Blood tests.  Imaging tests, such as an ultrasound, CT scan, or MRI.  A liver biopsy. A small sample of liver tissue is removed using a needle. The sample is then looked at under a microscope. How is this treated? Fatty liver disease is often caused by other health conditions. Treatment for fatty liver may involve medicines and lifestyle changes to manage conditions such as:  Alcoholism.  High cholesterol.  Diabetes.  Being overweight or obese. Follow these instructions at home:   Do not drink alcohol. If you have trouble quitting, ask your health care provider how to safely quit with the help of medicine or a supervised program. This is important to keep your condition from getting worse.  Eat a healthy diet as told by your health care provider. Ask your health care provider about working with a diet and nutrition specialist (dietitian) to develop an eating plan.  Exercise regularly. This can help you lose weight and control your cholesterol and diabetes. Talk to your health care provider about an exercise plan and which activities are best for you.  Take over-the-counter and prescription medicines only as told by your health care provider.  Keep all follow-up visits as told by your health care provider. This is important. Contact a health care provider if: You have trouble controlling your:  Blood  sugar. This is especially important if you have diabetes.  Cholesterol.  Drinking of alcohol. Get help right away if:  You have abdominal pain.  You have jaundice.  You have nausea and vomiting.  You vomit blood or material that looks like coffee grounds.  You have stools that are  black, tar-like, or bloody. Summary  Fatty liver disease develops when too much fat builds up in the cells of your liver.  Fatty liver disease often causes no symptoms or problems. However, over time, fatty liver can cause inflammation that may lead to more serious liver problems, such as scarring of the liver (cirrhosis).  You are more likely to develop this condition if you abuse alcohol, are pregnant, are overweight, have diabetes, have hepatitis, or have high triglyceride levels.  Contact your health care provider if you have trouble controlling your weight, blood sugar, cholesterol, or drinking of alcohol. This information is not intended to replace advice given to you by your health care provider. Make sure you discuss any questions you have with your health care provider. Document Revised: 01/28/2017 Document Reviewed: 11/24/2016 Elsevier Patient Education  2020 [place].  Nonalcoholic Fatty Liver Disease Diet, Adult Nonalcoholic fatty liver disease is a condition that causes fat to build up in and around the liver. The disease makes it harder for the liver to work the way that it should. Following a healthy diet can help to keep nonalcoholic fatty liver disease under control. It can also help to prevent or improve conditions that are associated with the disease, such as heart disease, diabetes, high blood pressure, and abnormal cholesterol levels. Along with regular exercise, this diet:  Promotes weight loss.  Helps to control blood sugar levels.  Helps to improve the way that the body uses insulin. What are tips for following this plan? Reading food labels Always check food labels for:  The amount of saturated fat in a food. You should limit your intake of saturated fat. Saturated fat is found in foods that come from animals, including meat and dairy products such as butter, cheese, and whole milk.  The amount of fiber in a food. You should choose high-fiber foods such as  fruits, vegetables, and whole grains. Try to get 25-30 grams (g) of fiber a day.  Cooking  When cooking, use heart-healthy oils that are high in monounsaturated fats. These include olive oil, canola oil, and avocado oil.  Limit frying or deep-frying foods. [place]er foods using healthy methods such as baking, boiling, steaming, and grilling instead. Meal planning  You may want to keep track of how many calories you take in. Eating the right amount of calories will help you achieve a healthy weight. Meeting with a registered dietitian can help you get started.  Limit how often you eat takeout and fast food. These foods are usually very high in fat, salt, and sugar.  Use the glycemic index (GI) to plan your meals. The index tells you how quickly a food will raise your blood sugar. Choose low-GI foods (GI less than 55). These foods take a longer time to raise blood sugar. A registered dietitian can help you identify foods lower on the GI scale. Lifestyle  You may want to follow a Mediterranean diet. This diet includes a lot of vegetables, lean meats or fish, whole grains, fruits, and healthy oils and fats. What foods can I eat?  Fruits Bananas. Apples. Oranges. Grapes. Papaya. Mango. Pomegranate. Kiwi. Grapefruit. Cherries. Vegetables Lettuce. Spinach. Peas. Beets. Cauliflower. Cabbage. Broccoli. Carrots.  Tomatoes. Squash. Eggplant. Herbs. Peppers. Onions. Cucumbers. Brussels sprouts. Yams and sweet potatoes. Beans. Lentils. Grains Whole wheat or whole-grain foods, including breads, crackers, cereals, and pasta. Stone-ground whole wheat. Unsweetened oatmeal. Bulgur. Barley. Quinoa. Brown or wild rice. Corn or whole wheat flour tortillas. Meats and other proteins Lean meats. Poultry. Tofu. Seafood and shellfish. Dairy Low-fat or fat-free dairy products, such as yogurt, cottage cheese, or cheese. Beverages Water. Sugar-free drinks. Tea. Coffee. Low-fat or skim milk. Milk alternatives, such as soy  or almond milk. Real fruit juice. Fats and oils Avocado. Canola or olive oil. Nuts and nut butters. Seeds. Seasonings and condiments Mustard. Relish. Low-fat, low-sugar ketchup and barbecue sauce. Low-fat or fat-free mayonnaise. Sweets and desserts Sugar-free sweets. The items listed above may not be a complete list of foods and beverages you can eat. Contact a dietitian for more information. What foods should I limit or avoid? Meats and other proteins Limit red meat to 1-2 times a week. Dairy [employer]. Fats and oils Palm oil and coconut oil. Fried foods. Other foods Processed foods. Foods that contain a lot of salt or sodium. Sweets and desserts Sweets that contain sugar. Beverages Sweetened drinks, such as sweet tea, milkshakes, iced sweet drinks, and sodas. Alcohol. The items listed above may not be a complete list of foods and beverages you should avoid. Contact a dietitian for more information. Where to find more information The [employer] of Diabetes and Digestive and Kidney Diseases: [employer] Summary  Nonalcoholic fatty liver disease is a condition that causes fat to build up in and around the liver.  Following a healthy diet can help to keep nonalcoholic fatty liver disease under control. Your diet should be rich in fruits, vegetables, whole grains, and lean proteins.  Limit your intake of saturated fat. Saturated fat is found in foods that come from animals, including meat and dairy products such as butter, cheese, and whole milk.  This diet promotes weight loss, helps to control blood sugar levels, and helps to improve the way that the body uses insulin. This information is not intended to replace advice given to you by your health care provider. Make sure you discuss any questions you have with your health care provider. Document Revised: 06/09/2018 Document Reviewed: 03/09/2018 Elsevier Patient Education  [place].

## 2020-02-05 NOTE — Progress Notes (Signed)
Primary Care Physician: [facility]  Primary Gastroenterologist:  Garfield Cornea, MD   Chief Complaint  Patient presents with  . Diverticulitis    doing fine. no complaints    HPI: Tiffany Ward is a 34 y.o. female here for follow up diverticulitis, abnormal LFTs.   Patient last seen in the office back in June.  She denies any further episodes of diverticulitis since we last saw her.  She completed colonoscopy in November that showed diverticulosis.  Plan for next colonoscopy at age 28.   Overall doing well.  Denies any abdominal pain during the day.  Sometimes when she lays on her left side at night she has some discomfort.  No nausea or vomiting.  No heartburn.  Bowel movement a couple times daily.  No blood in the stool or melena.  She states her last A1c was 7.1.  This was done at the health department about a month ago.  She is not sure what other labs that she has had checked.  She is due for LFTs at this time.  Has a history of fatty liver on imaging and mildly elevated ALT of 38 back in June.  Current Outpatient Medications  Medication Sig Dispense Refill  . atorvastatin (LIPITOR) 10 MG tablet Take 10 mg by mouth at bedtime.    . Cholecalciferol (VITAMIN D3) 1.25 MG (50000 UT) CAPS Take 5,000 Units by mouth every 7 (seven) days.     Marland Kitchen etonogestrel (NEXPLANON) 68 MG IMPL implant 68 mg by Subdermal route once.     . metFORMIN (GLUCOPHAGE-XR) 500 MG 24 hr tablet Take 500 mg by mouth at bedtime.    . montelukast (SINGULAIR) 10 MG tablet Take 10 mg by mouth at bedtime.      No current facility-administered medications for this visit.    Allergies as of 02/05/2020 - Review Complete 12/31/2019  Allergen Reaction Noted  . Codeine Itching and Rash 08/15/2013    ROS:  General: Negative for anorexia, weight loss, fever, chills, fatigue, weakness. ENT: Negative for hoarseness, difficulty swallowing , nasal congestion. CV: Negative for chest  pain, angina, palpitations, dyspnea on exertion, peripheral edema.  Respiratory: Negative for dyspnea at rest, dyspnea on exertion, cough, sputum, wheezing.  GI: See history of present illness. GU:  Negative for dysuria, hematuria, urinary incontinence, urinary frequency, nocturnal urination.  Endo: Negative for unusual weight change.    Physical Examination:   BP 128/78   Pulse 61   Temp (!) 97 F (36.1 C)   Ht 5\' 10"  (1.778 m)   Wt (!) 302 lb (137 kg)   BMI 43.33 kg/m   General: Well-nourished, well-developed in no acute distress.  Eyes: No icterus. Mouth: masked Abdomen: Bowel sounds are normal, nontender, nondistended, no hepatosplenomegaly or masses, no abdominal bruits or hernia , no rebound or guarding.  Exam limited due to body habitus. Extremities: No lower extremity edema. No clubbing or deformities. Neuro: Alert and oriented x 4   Skin: Warm and dry, no jaundice.   Psych: Alert and cooperative, normal mood and affect.  Labs:  Lab Results  Component Value Date   ALT 38 (H) 08/03/2019   AST 27 08/03/2019   ALKPHOS 55 10/03/2013   BILITOT 0.3 08/03/2019    Imaging Studies: No results found.  Impression/plan:  34 year old female with history of diverticulitis, fatty liver.  Completed colonoscopy November 2021.  Evidence of diverticulosis noted throughout her entire colon.  Diverticulosis: Several episodes of diverticulitis  as previously outlined.  Greater than 6 months since her last episode.  She needs to increase dietary fiber.  We will add fiber supplement daily.  Monitor for any recurrent episodes.    Fatty liver: In the setting of diabetes, hyperlipidemia, obesity.  ALT mildly elevated as outlined above.  Due for repeat labs at this time.  She recently had labs at the health department, we have requested records.  If no LFTs, she will have them done.  Encouraged her to walk 30 minutes daily at least 5 days/week.  Continue to try to improve on her A1c.  Strive  for slow gradual weight loss.  We will have her come back to the office in 1 year but would advised to repeat LFTs at least twice yearly.

## 2020-04-17 ENCOUNTER — Emergency Department (HOSPITAL_COMMUNITY)
Admission: EM | Admit: 2020-04-17 | Discharge: 2020-04-17 | Disposition: A | Discharge status: Home / Self Care | Payer: Medicaid Other | Attending: Emergency Medicine | Admitting: Emergency Medicine

## 2020-04-17 ENCOUNTER — Encounter (HOSPITAL_COMMUNITY): Payer: Self-pay | Admitting: Emergency Medicine

## 2020-04-17 ENCOUNTER — Other Ambulatory Visit: Payer: Self-pay

## 2020-04-17 DIAGNOSIS — E1169 Type 2 diabetes mellitus with other specified complication: Secondary | ICD-10-CM | POA: Insufficient documentation

## 2020-04-17 DIAGNOSIS — H9589 Other postprocedural complications and disorders of the ear and mastoid process, not elsewhere classified: Secondary | ICD-10-CM | POA: Insufficient documentation

## 2020-04-17 DIAGNOSIS — Z7984 Long term (current) use of oral hypoglycemic drugs: Secondary | ICD-10-CM | POA: Diagnosis not present

## 2020-04-17 DIAGNOSIS — Z9889 Other specified postprocedural states: Secondary | ICD-10-CM | POA: Diagnosis not present

## 2020-04-17 DIAGNOSIS — H9222 Otorrhagia, left ear: Secondary | ICD-10-CM | POA: Insufficient documentation

## 2020-04-17 DIAGNOSIS — Z79899 Other long term (current) drug therapy: Secondary | ICD-10-CM | POA: Insufficient documentation

## 2020-04-17 MED ORDER — CIPROFLOXACIN-DEXAMETHASONE 0.3-0.1 % OT SUSP: 4.0000 [drp] | Freq: Three times a day (TID) | OTIC | 0 refills | Status: DC

## 2020-04-17 NOTE — ED Provider Notes (Signed)
[facility] EMERGENCY DEPARTMENT Provider Note   CSN: [id number] Arrival date & time: 04/17/20  6761     History Chief Complaint  Patient presents with  . Post-op Problem    Tiffany Ward is a 35 y.o. female.  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  This was performed last month by Dr. Lucius Conn at [facility].  Patient has been having drainage and a full sensation behind the ear and coming from the ear since the surgery.  This morning, she woke up and noticed that there was blood all over the pillow and blood coming from her left ear.  She denies any fevers or chills.  The history is provided by the patient.       Past Medical History:  Diagnosis Date  . Deafness in left ear   . Diabetes mellitus without complication ([place])   . Diverticulitis   . Sleep apnea    does not use cpap    Patient Active Problem List   Diagnosis Date Noted  . Diverticulitis   . Fatty liver   . Post-operative state 10/17/2013  . Obesity, morbid ([place]) 10/09/2013  . PCOS (polycystic ovarian syndrome) 10/24/2012  . Mild dysplasia of cervix 10/24/2012    Past Surgical History:  Procedure Laterality Date  . CESAREAN SECTION    . COLONOSCOPY WITH PROPOFOL N/A 12/31/2019   rourk: diverticulosis. next colonoscopy at age 52  . LAPAROSCOPIC SALPINGO OOPHERECTOMY Right 10/09/2013   Procedure: LAPAROSCOPIC RIGHT OOPHORECTOMY;  Surgeon: Jonnie Kind, MD;  Location: [facility];  Service: Gynecology;  Laterality: Right;  . LT.EAR SURGERY       OB History    Gravida  1   Para  1   Term  1   Preterm      AB      Living  1     SAB      IAB      Ectopic      Multiple      Live Births  1           Family History  Problem Relation Age of Onset  . Diabetes Mother   . Hypertension Mother   . Hypertension Father   . Hypertension  Sister   . Hypertension Paternal Grandmother   . Hypertension Sister   . Diabetes Sister   . Diverticulitis Other        Multiple family members  . Colon cancer Neg Hx     Social History   Tobacco Use  . Smoking status: Never Smoker  . Smokeless tobacco: Never Used  . Tobacco comment: never used snuff or chewing tobacco.  Substance Use Topics  . Alcohol use: No  . Drug use: No    Home Medications Prior to Admission medications   Medication Sig Start Date End Date Taking? Authorizing Provider  atorvastatin (LIPITOR) 10 MG tablet Take 10 mg by mouth at bedtime. 07/03/19   [provider]  Cholecalciferol (VITAMIN D3) 1.25 MG (50000 UT) CAPS Take 5,000 Units by mouth every 7 (seven) days.  07/03/19   [provider]  etonogestrel (NEXPLANON) 68 MG IMPL implant 68 mg by Subdermal route once.     [provider]  metFORMIN (GLUCOPHAGE-XR) 500 MG 24 hr tablet Take 500 mg by mouth at bedtime. 07/04/19   [provider]  montelukast (SINGULAIR) 10 MG tablet Take 10 mg by mouth at bedtime.  07/03/19   [provider]    Allergies    Codeine  Review of Systems   Review of Systems  All other systems reviewed and are negative.   Physical Exam Updated Vital Signs BP (!) 149/95 (BP Location: Left Arm)   Pulse 79   Temp 98.3 F (36.8 C) (Oral)   Resp 16   Ht 5\' 8"  (1.727 m)   Wt 131.5 kg   SpO2 100%   BMI 44.09 kg/m   Physical Exam Vitals and nursing note reviewed.  Constitutional:      General: She is not in acute distress.    Appearance: Normal appearance. She is not ill-appearing.  HENT:     Head: Normocephalic and atraumatic.     Ears:     Comments: The left ear canal has blood along with postsurgical changes.  There is no active bleeding.  There is no purulent drainage or erythema. Pulmonary:     Effort: Pulmonary effort is normal.  Skin:    General: Skin is warm and dry.  Neurological:     Mental Status: She is alert.      ED Results / Procedures / Treatments   Labs (all labs ordered are listed, but only abnormal results are displayed) Labs Reviewed - No data to display  EKG None  Radiology No results found.  Procedures Procedures   Medications Ordered in ED Medications - No data to display  ED Course  I have reviewed the triage vital signs and the nursing notes.  Pertinent labs & imaging results that were available during my care of the patient were reviewed by me and considered in my medical decision making (see chart for details).    MDM Rules/Calculators/A&P  Patient presenting here with bleeding from her recently operated on left ear.  It seemed to resolve prior to arriving here.  She has been observed in the ER for 2 hours with no further bleeding.  I have spoken with the patient's ENT, Dr. Lucius Conn who does not feel as though any transfer or intervention is indicated.  He believes that it is not atypical for bleeding to occur in the postsurgical setting after a procedure such as this.  He is recommending continuance of Ciprodex drops.  Patient is to follow-up next week as scheduled.  Final Clinical Impression(s) / ED Diagnoses Final diagnoses:  None    Rx / DC Orders ED Discharge Orders    None       Veryl Speak, MD 04/17/20 [phone]

## 2020-04-17 NOTE — Discharge Instructions (Addendum)
Begin using Ciprodex drops as prescribed today.  Follow-up with your ENT next week as scheduled, and return to the ER for any new and/or concerning symptoms.

## 2020-04-17 NOTE — ED Triage Notes (Signed)
Pt had surgery done to left ear about 1 month ago. Pt was concerned for infection about 2 weeks and was told to continue to use her ear drops. This morning pt woke up to ear bleeding and feeling dizzy.

## 2020-09-16 IMAGING — CT CT RENAL STONE PROTOCOL
2 of 4 series · 17 of 46 positions shown, 19 images · non-contrast
Comparison: None.

CLINICAL DATA: Flank pain, right

EXAM:
CT ABDOMEN AND PELVIS WITHOUT CONTRAST
TECHNIQUE: Multidetector CT imaging of the abdomen and pelvis was performed
following the standard protocol without IV contrast.

[Series 2: axial st · axial · 0.80mm/px · z∈[+614,+1014]mm · 14 of 90 slices shown, 16 images]
[im 5/90  soft-tissue]
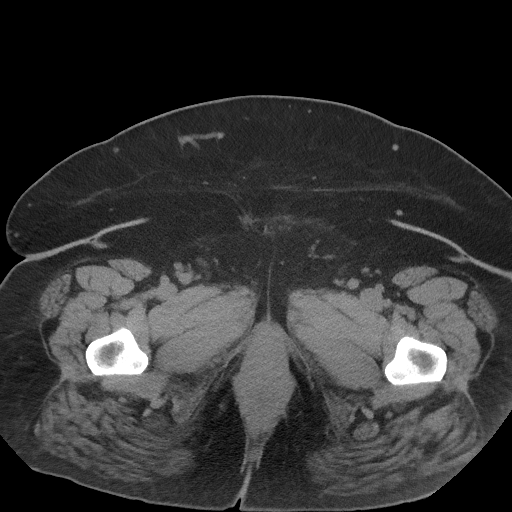
[im 5/90  bone]
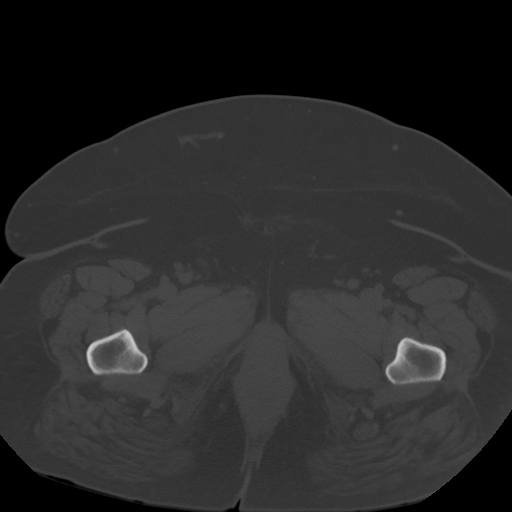
[im 10/90  soft-tissue]
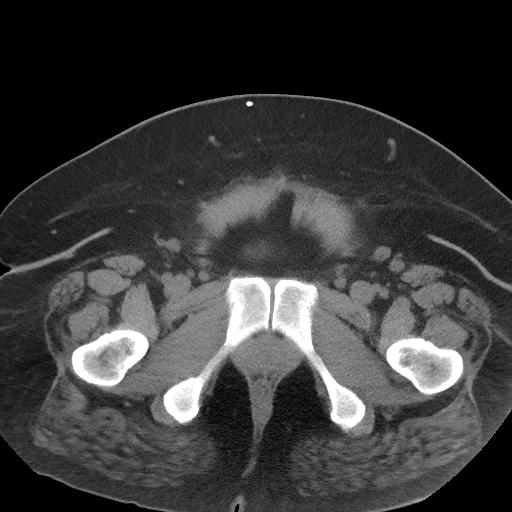
[im 20/90  soft-tissue]
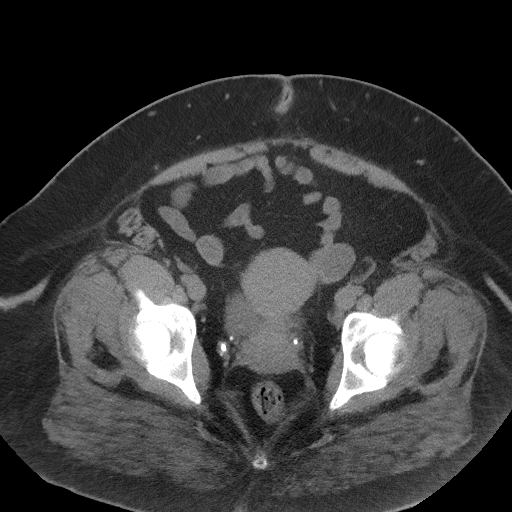
[im 25/90  soft-tissue]
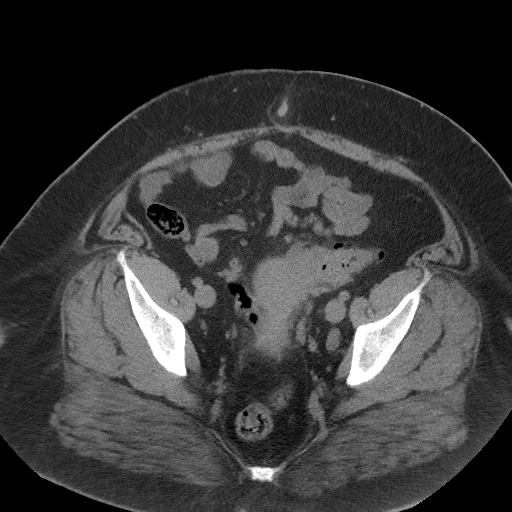
[im 30/90  soft-tissue]
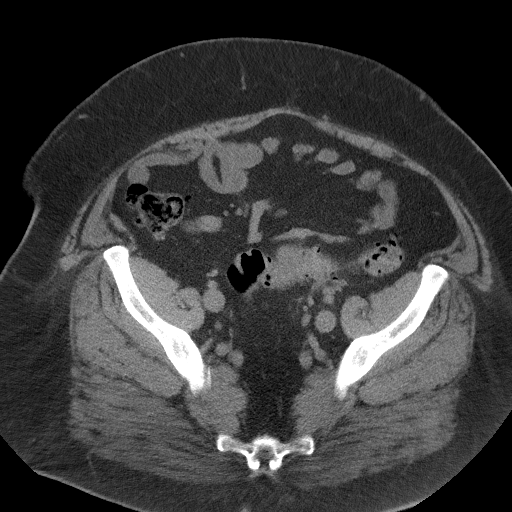
[im 35/90  soft-tissue]
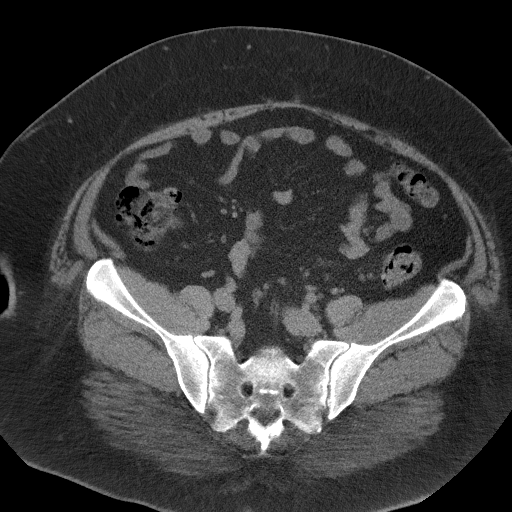
[im 40/90  soft-tissue]
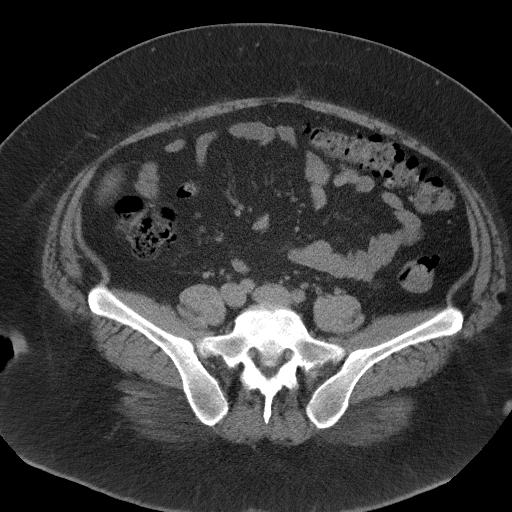
[im 50/90  soft-tissue]
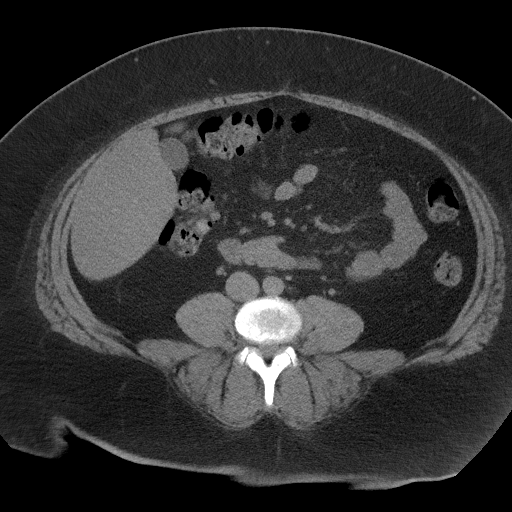
[im 55/90  soft-tissue]
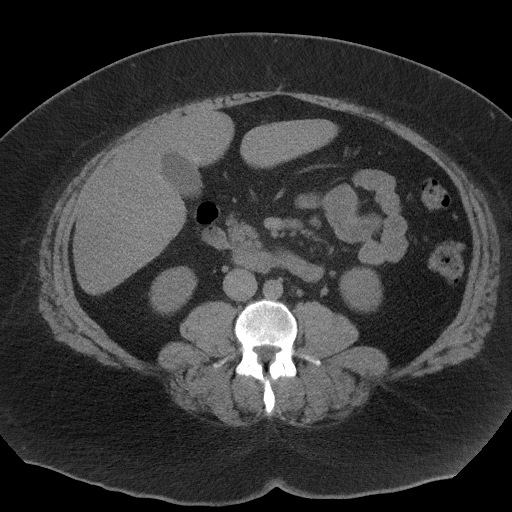
[im 55/90  bone]
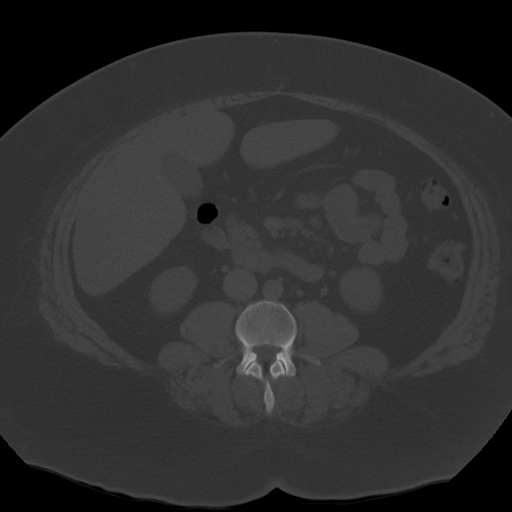
[im 60/90  soft-tissue]
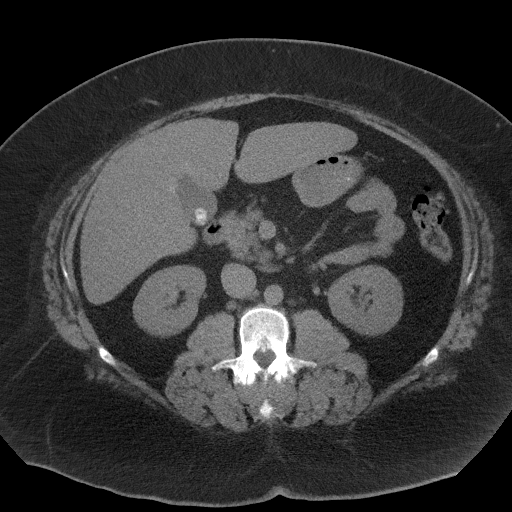
[im 65/90  soft-tissue]
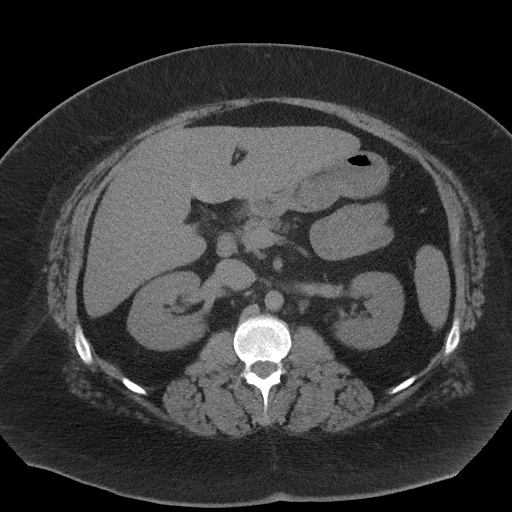
[im 70/90  soft-tissue]
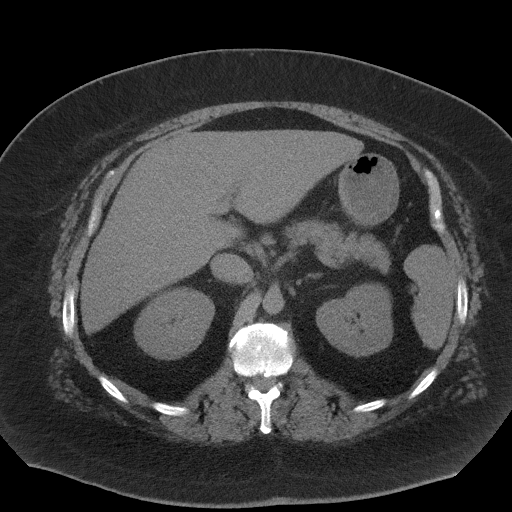
[im 80/90  soft-tissue]
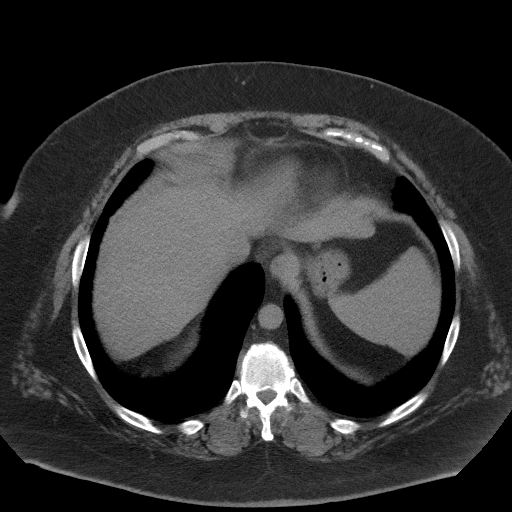
[im 85/90  soft-tissue]
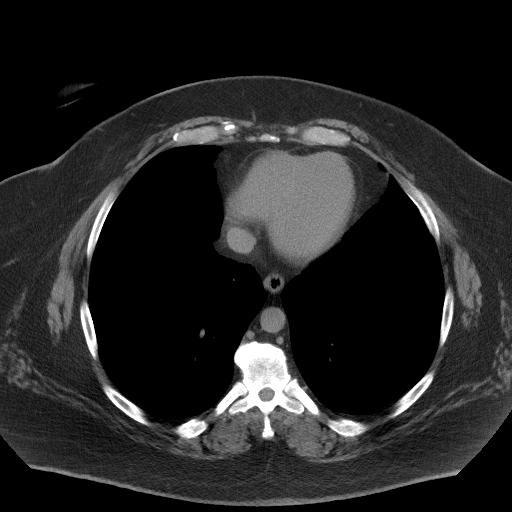

[Series 5: coronal st · coronal · 0.86mm/px · 3 of 133 slices shown]
[im 45/133  soft-tissue]
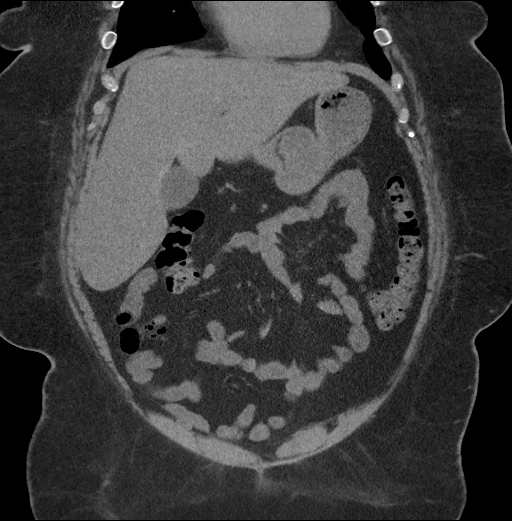
[im 59/133  soft-tissue]
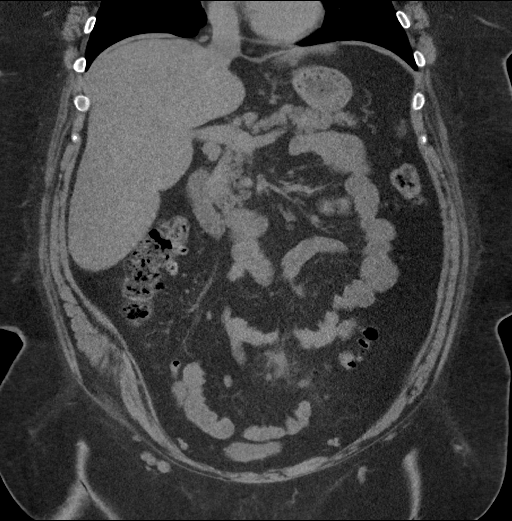
[im 74/133  soft-tissue]
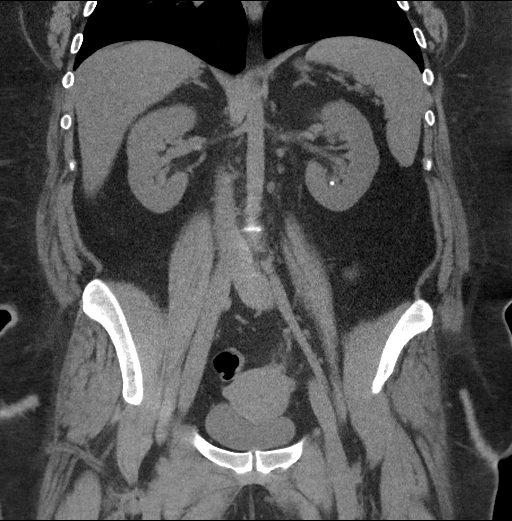

[17 of 46 positions shown; findings below may reference images not displayed]

FINDINGS: LOWER CHEST: Normal.

HEPATOBILIARY: Diffuse hypoattenuation of the liver relative to the
spleen suggests hepatic steatosis. No focal liver lesion or biliary
dilatation. There is cholelithiasis without acute inflammation.

PANCREAS: Normal pancreas. No ductal dilatation or peripancreatic
fluid collection.

SPLEEN: Normal.

ADRENALS/URINARY TRACT: The adrenal glands are normal. There is no
hydronephrosis. No right nephroureterolithiasis. On the left, there
is a 3 mm stone at the renal pelvis without evidence of obstruction.
There is also a 5 mm lower pole stone. The urinary bladder is normal
for degree of distention

STOMACH/BOWEL: There is no hiatal hernia. Normal duodenal course and
caliber. No small bowel dilatation or inflammation. There is
rectosigmoid diverticulosis with acute inflammation. Six no free
intraperitoneal air or fluid collection. Normal appendix.

VASCULAR/LYMPHATIC: Normal course and caliber of the major abdominal
vessels. No abdominal or pelvic lymphadenopathy.

REPRODUCTIVE: Normal uterus and ovaries.

MUSCULOSKELETAL. No bony spinal canal stenosis or focal osseous
abnormality.

OTHER: None.
IMPRESSION: 1. Acute sigmoid diverticulitis without abscess or free
intraperitoneal air.
2. Nonobstructive left nephrolithiasis measuring up to 5 mm.
3. No right hydronephrosis or nephroureterolithiasis.
4. Hepatic steatosis and cholelithiasis without acute inflammation.

## 2021-02-25 ENCOUNTER — Encounter: Payer: Self-pay | Admitting: Internal Medicine

## 2021-03-15 ENCOUNTER — Encounter (HOSPITAL_COMMUNITY): Payer: Self-pay

## 2021-03-15 ENCOUNTER — Emergency Department (HOSPITAL_COMMUNITY)
Admission: EM | Admit: 2021-03-15 | Discharge: 2021-03-15 | Disposition: A | Discharge status: Home / Self Care | Payer: Medicaid Other | Attending: Emergency Medicine | Admitting: Emergency Medicine

## 2021-03-15 ENCOUNTER — Other Ambulatory Visit: Payer: Self-pay

## 2021-03-15 DIAGNOSIS — R350 Frequency of micturition: Secondary | ICD-10-CM | POA: Diagnosis not present

## 2021-03-15 DIAGNOSIS — R109 Unspecified abdominal pain: Secondary | ICD-10-CM | POA: Diagnosis present

## 2021-03-15 DIAGNOSIS — Z87442 Personal history of urinary calculi: Secondary | ICD-10-CM | POA: Diagnosis not present

## 2021-03-15 DIAGNOSIS — R112 Nausea with vomiting, unspecified: Secondary | ICD-10-CM | POA: Diagnosis not present

## 2021-03-15 LAB — CBC WITH DIFFERENTIAL/PLATELET
Abs Immature Granulocytes: 0.06 10*3/uL (ref 0.00–0.07)
Basophils Absolute: 0.1 10*3/uL (ref 0.0–0.1)
Basophils Relative: 0 %
Eosinophils Absolute: 0.1 10*3/uL (ref 0.0–0.5)
Eosinophils Relative: 1 %
HCT: 42.9 % (ref 36.0–46.0)
Hemoglobin: 14.5 g/dL (ref 12.0–15.0)
Immature Granulocytes: 1 %
Lymphocytes Relative: 20 %
Lymphs Abs: 2.6 10*3/uL (ref 0.7–4.0)
MCH: 30.7 pg (ref 26.0–34.0)
MCHC: 33.8 g/dL (ref 30.0–36.0)
MCV: 90.7 fL (ref 80.0–100.0)
Monocytes Absolute: 0.8 10*3/uL (ref 0.1–1.0)
Monocytes Relative: 6 %
Neutro Abs: 9.6 10*3/uL — ABNORMAL HIGH (ref 1.7–7.7)
Neutrophils Relative %: 72 %
Platelets: 215 10*3/uL (ref 150–400)
RBC: 4.73 MIL/uL (ref 3.87–5.11)
RDW: 12.8 % (ref 11.5–15.5)
WBC: 13.2 10*3/uL — ABNORMAL HIGH (ref 4.0–10.5)
nRBC: 0 % (ref 0.0–0.2)

## 2021-03-15 LAB — URINALYSIS, ROUTINE W REFLEX MICROSCOPIC
Bilirubin Urine: NEGATIVE
Glucose, UA: 250 mg/dL — AB
Ketones, ur: NEGATIVE mg/dL
Leukocytes,Ua: NEGATIVE
Nitrite: NEGATIVE
Protein, ur: NEGATIVE mg/dL
Specific Gravity, Urine: >1.03 — ABNORMAL HIGH (ref 1.005–1.030)
pH: 5.5 (ref 5.0–8.0)

## 2021-03-15 LAB — BASIC METABOLIC PANEL
Anion gap: 8 (ref 5–15)
BUN: 16 mg/dL (ref 6–20)
CO2: 23 mmol/L (ref 22–32)
Calcium: 8.7 mg/dL — ABNORMAL LOW (ref 8.9–10.3)
Chloride: 104 mmol/L (ref 98–111)
Creatinine, Ser: 0.97 mg/dL (ref 0.44–1.00)
GFR, Estimated: >60 mL/min (ref >60)
Glucose, Bld: 213 mg/dL — ABNORMAL HIGH (ref 70–99)
Potassium: 3.6 mmol/L (ref 3.5–5.1)
Sodium: 135 mmol/L (ref 135–145)

## 2021-03-15 LAB — PREGNANCY, URINE: Preg Test, Ur: NEGATIVE

## 2021-03-15 MED ORDER — AMOXICILLIN-POT CLAVULANATE 875-125 MG PO TABS: 1.0000 | ORAL_TABLET | Freq: Two times a day (BID) | ORAL | 0 refills | Status: DC

## 2021-03-15 MED ORDER — FENTANYL CITRATE PF 50 MCG/ML IJ SOSY
100.0000 ug | PREFILLED_SYRINGE | Freq: Once | INTRAMUSCULAR | Status: AC
  Administered 2021-03-15: 100 ug via INTRAVENOUS
  Filled 2021-03-15: qty 2

## 2021-03-15 MED ORDER — ONDANSETRON HCL 4 MG/2ML IJ SOLN
4.0000 mg | Freq: Once | INTRAMUSCULAR | Status: AC
  Administered 2021-03-15: 4 mg via INTRAVENOUS
  Filled 2021-03-15: qty 2

## 2021-03-15 MED ORDER — KETOROLAC TROMETHAMINE 30 MG/ML IJ SOLN
30.0000 mg | Freq: Once | INTRAMUSCULAR | Status: AC
  Administered 2021-03-15: 30 mg via INTRAVENOUS
  Filled 2021-03-15: qty 1

## 2021-03-15 NOTE — ED Triage Notes (Signed)
Pt states she started having L flank pain this morning. History of kidney stones since November. Pt says she has appointment with urology in [place], Having N/V.

## 2021-03-15 NOTE — ED Provider Notes (Signed)
[facility] EMERGENCY DEPARTMENT Provider Note   CSN: [id number] Arrival date & time: 03/15/21  0319     History  Chief Complaint  Patient presents with   Flank Pain    Tiffany Ward is a 36 y.o. female.  The history is provided by the patient.  Flank Pain This is a new problem. The current episode started 6 to 12 hours ago. The problem occurs constantly. The problem has been gradually worsening. Associated symptoms include abdominal pain. Pertinent negatives include no chest pain. Nothing aggravates the symptoms. Nothing relieves the symptoms.  Patient reports onset of left flank pain that radiates into her abdomen.  She also reports urinary frequency but no dysuria.  She reports nausea and vomiting.  No vaginal bleeding  Patient reports previous history of kidney stone    Home Medications Prior to Admission medications   Medication Sig Start Date End Date Taking? Authorizing Provider  amoxicillin-clavulanate (AUGMENTIN) 875-125 MG tablet Take 1 tablet by mouth 2 (two) times daily. One po bid x 7 days 03/15/21  Yes Ripley Fraise, MD  atorvastatin (LIPITOR) 10 MG tablet Take 10 mg by mouth at bedtime. 07/03/19   [provider]  Cholecalciferol (VITAMIN D3) 1.25 MG (50000 UT) CAPS Take 5,000 Units by mouth every 7 (seven) days.  07/03/19   [provider]  ciprofloxacin-dexamethasone (CIPRODEX) OTIC suspension Place 4 drops into the left ear 3 (three) times daily. 04/17/20   Veryl Speak, MD  etonogestrel (NEXPLANON) 68 MG IMPL implant 68 mg by Subdermal route once.     [provider]  metFORMIN (GLUCOPHAGE-XR) 500 MG 24 hr tablet Take 500 mg by mouth at bedtime. 07/04/19   [provider]  montelukast (SINGULAIR) 10 MG tablet Take 10 mg by mouth at bedtime.  07/03/19   [provider]      Allergies    Codeine    Review of Systems   Review of Systems  Constitutional:  Negative for fever.  Cardiovascular:  Negative for chest pain.   Gastrointestinal:  Positive for abdominal pain, diarrhea, nausea and vomiting.  Genitourinary:  Positive for flank pain. Negative for vaginal bleeding.  All other systems reviewed and are negative.  Physical Exam Updated Vital Signs BP 131/76    Pulse 87    Temp 98.3 F (36.8 C)    Resp 18    Ht 1.727 m (5\' 8" )    Wt 132.9 kg    SpO2 100%    BMI 44.55 kg/m  Physical Exam CONSTITUTIONAL: Well developed/well nourished, uncomfortable. HEAD: Normocephalic/atraumatic EYES: EOMI/PERRL ENMT: Mucous membranes moist NECK: supple no meningeal signs SPINE/BACK:entire spine nontender CV: S1/S2 noted, no murmurs/rubs/gallops noted LUNGS: Lungs are clear to auscultation bilaterally, no apparent distress ABDOMEN: soft, nontender, no rebound or guarding, bowel sounds noted throughout abdomen, difficult exam due to morbid obesity GU: Left cva tenderness NEURO: Pt is awake/alert/appropriate, moves all extremitiesx4.  No facial droop.   EXTREMITIES: pulses normal/equal, full ROM SKIN: warm, color normal PSYCH: no abnormalities of mood noted, alert and oriented to situation  ED Results / Procedures / Treatments   Labs (all labs ordered are listed, but only abnormal results are displayed) Labs Reviewed  BASIC METABOLIC PANEL - Abnormal; Notable for the following components:      Result Value   Glucose, Bld 213 (*)    Calcium 8.7 (*)    All other components within normal limits  CBC WITH DIFFERENTIAL/PLATELET - Abnormal; Notable for the following components:   WBC 13.2 (*)  Neutro Abs 9.6 (*)    All other components within normal limits  URINALYSIS, ROUTINE W REFLEX MICROSCOPIC - Abnormal; Notable for the following components:   Color, Urine YELLOW (*)    APPearance CLEAR (*)    Specific Gravity, Urine >1.030 (*)    Glucose, UA 250 (*)    Hgb urine dipstick MODERATE (*)    All other components within normal limits  PREGNANCY, URINE    EKG None  Radiology No results  found.  Procedures Procedures    Medications Ordered in ED Medications  fentaNYL (SUBLIMAZE) injection 100 mcg (100 mcg Intravenous Given 03/15/21 0447)  ondansetron (ZOFRAN) injection 4 mg (4 mg Intravenous Given 03/15/21 0447)  ketorolac (TORADOL) 30 MG/ML injection 30 mg (30 mg Intravenous Given 03/15/21 0538)    ED Course/ Medical Decision Making/ A&P Clinical Course as of 03/15/21 0622  Sun Mar 15, 2021  0516 Hgb urine dipstickMarland Kitchen): MODERATE Hematuria [DW]  0516 Glucose(!): 213 Hyperglycemia [DW]  0516 WBC(!): 13.2 Leukocytosis [DW]    Clinical Course User Index [DW] Ripley Fraise, MD                           Medical Decision Making  This patient presents to the ED for concern of flank pain, this involves an extensive number of treatment options, and is a complaint that carries with it a high risk of complications and morbidity.  The differential diagnosis includes kidney stone, pyelonephritis, UTI, muscle strain  Comorbidities that complicate the patient evaluation: Patients presentation is complicated by their history of morbid obesity  Additional history obtained: Records reviewed previous admission documents  Lab Tests: I Ordered, and personally interpreted labs.  The pertinent results include: Hematuria  I  Medicines ordered and prescription drug management: I ordered medication including IV fentanyl and Toradol for flank pain Reevaluation of the patient after these medicines showed that the patient    improved  Test Considered: Considered CT imaging, patient has had this previously which has  demonstrated kidney stone Given the patient is improving, with classic symptoms of kidney stone will defer imaging for now    Reevaluation: After the interventions noted above, I reevaluated the patient and found that they have :improved  Complexity of problems addressed: Patients presentation is most consistent with  acute complicated illness/injury requiring  diagnostic workup      Disposition: After consideration of the diagnostic results and the patients response to treatment,  I feel that the patent would benefit from discharge .   Patient reports previous history of kidney stone, this feels similar.  She also has previous history of diverticulitis and similar pain is felt like that.  She also reports some diarrhea Patient reports previous colonoscopies for diverticulitis  Due to patient's previous history of diverticulitis and reported diarrhea, will also add on antibiotics to cover for this condition.  Overall patient is well-appearing.  She does not require imaging at this time.  She already has follow-up with urology.  She declines pain medicines.  We discussed return precautions        Final Clinical Impression(s) / ED Diagnoses Final diagnoses:  Left flank pain    Rx / DC Orders ED Discharge Orders          Ordered    amoxicillin-clavulanate (AUGMENTIN) 875-125 MG tablet  2 times daily        03/15/21 0608              Martinique Pizzimenti,  Elenore Rota, MD 03/15/21 [phone]

## 2021-03-20 ENCOUNTER — Other Ambulatory Visit: Payer: Self-pay

## 2021-03-20 DIAGNOSIS — N200 Calculus of kidney: Secondary | ICD-10-CM

## 2021-03-25 ENCOUNTER — Ambulatory Visit: Payer: Medicaid Other | Admitting: Urology

## 2021-03-25 DIAGNOSIS — N200 Calculus of kidney: Secondary | ICD-10-CM

## 2021-04-03 ENCOUNTER — Encounter: Payer: Self-pay | Admitting: Urology

## 2021-04-03 ENCOUNTER — Ambulatory Visit (HOSPITAL_COMMUNITY)
Admission: RE | Admit: 2021-04-03 | Discharge: 2021-04-03 | Disposition: A | Discharge status: Home / Self Care | Payer: Medicaid Other | Source: Ambulatory Visit | Attending: Urology | Admitting: Urology

## 2021-04-03 ENCOUNTER — Other Ambulatory Visit: Payer: Self-pay

## 2021-04-03 ENCOUNTER — Ambulatory Visit: Payer: Medicaid Other | Admitting: Urology

## 2021-04-03 ENCOUNTER — Ambulatory Visit (INDEPENDENT_AMBULATORY_CARE_PROVIDER_SITE_OTHER): Payer: Medicaid Other | Admitting: Urology

## 2021-04-03 VITALS — BP 148/92 | HR 78

## 2021-04-03 DIAGNOSIS — N200 Calculus of kidney: Secondary | ICD-10-CM | POA: Diagnosis present

## 2021-04-03 LAB — URINALYSIS, ROUTINE W REFLEX MICROSCOPIC
Bilirubin, UA: NEGATIVE
Glucose, UA: NEGATIVE
Ketones, UA: NEGATIVE
Nitrite, UA: NEGATIVE
Protein,UA: NEGATIVE
Specific Gravity, UA: >1.03 — ABNORMAL HIGH (ref 1.005–1.030)
Urobilinogen, Ur: 0.2 mg/dL (ref 0.2–1.0)
pH, UA: 5.5 (ref 5.0–7.5)

## 2021-04-03 NOTE — Patient Instructions (Signed)
Ureteroscopy °Ureteroscopy is a procedure to check for and treat problems inside part of the urinary tract. In this procedure, a thin, flexible tube with a light at the end (ureteroscope) is used to look at the inside of the kidneys and the ureters. The ureters are the tubes that carry urine from the kidneys to the bladder. The ureteroscope is inserted into one or both of the ureters. °You may need this procedure if you have frequent urinary tract infections (UTIs), blood in your urine, or a stone in one of your ureters. A ureteroscopy can be done: °To find the cause of urine blockage in a ureter and to evaluate other abnormalities inside the ureters or kidneys. °To remove stones. °To remove or treat growths of tissue (polyps), abnormal tissue, and some types of tumors. °To remove a tissue sample and check it for disease under a microscope (biopsy). °Tell a health care provider about: °Any allergies you have. °All medicines you are taking, including vitamins, herbs, eye drops, creams, and over-the-counter medicines. °Any problems you or family members have had with anesthetic medicines. °Any blood disorders you have. °Any surgeries you have had. °Any medical conditions you have. °Whether you are pregnant or may be pregnant. °What are the risks? °Generally, this is a safe procedure. However, problems may occur, including: °Bleeding. °Infection. °Allergic reactions to medicines. °Scarring that narrows the ureter (stricture). °Creating a hole in the ureter (perforation). °What happens before the procedure? °Staying hydrated °Follow instructions from your health care provider about hydration, which may include: °Up to 2 hours before the procedure - you may continue to drink clear liquids, such as water, clear fruit juice, black coffee, and plain tea. ° °Eating and drinking restrictions °Follow instructions from your health care provider about eating and drinking, which may include: °8 hours before the procedure - stop  eating heavy meals or foods, such as meat, fried foods, or fatty foods. °6 hours before the procedure - stop eating light meals or foods, such as toast or cereal. °6 hours before the procedure - stop drinking milk or drinks that contain milk. °2 hours before the procedure - stop drinking clear liquids. °Medicines °Ask your health care provider about: °Changing or stopping your regular medicines. This is especially important if you are taking diabetes medicines or blood thinners. °Taking medicines such as aspirin and ibuprofen. These medicines can thin your blood. Do not take these medicines unless your health care provider tells you to take them. °Taking over-the-counter medicines, vitamins, herbs, and supplements. °General instructions °Do not use any products that contain nicotine or tobacco for at least 4 weeks before the procedure. These products include cigarettes, e-cigarettes, and chewing tobacco. If you need help quitting, ask your health care provider. °You may have a urine sample taken to check for infection. °Plan to have someone take you home from the hospital or clinic. °If you will be going home right after the procedure, plan to have someone with you for 24 hours. °Ask your health care provider what steps will be taken to help prevent infection. These may include: °Washing skin with a germ-killing soap. °Receiving antibiotic medicine. °What happens during the procedure? ° °An IV will be inserted into one of your veins. °You will be given one or more of the following: °A medicine to help you relax (sedative). °A medicine to make you fall asleep (general anesthetic). °A medicine that is injected into your spine to numb the area below and slightly above the injection site (spinal anesthetic). °The   part of your body that drains urine from your bladder (urethra) will be cleaned with a germ-killing solution. °The ureteroscope will be passed through your urethra into your bladder. °A salt-water solution will  be sent through the ureteroscope to fill your bladder. This will help the health care provider see the openings of your ureters more clearly. °The ureteroscope will be passed into your ureter. °If a growth is found, a biopsy may be done. °If a stone is found, it may be removed through the ureteroscope, or the stone may be broken up using a laser, shock waves, or electrical energy. °In some cases, if the ureter is too small, a tube may be inserted that keeps the ureter open (ureteral stent). The stent may be left in place for 1 or 2 weeks to keep the ureter open, and then the ureteroscopy procedure will be done. °The scope will be removed, and your bladder will be emptied. °The procedure may vary among health care providers and hospitals. °What can I expect after the procedure? °After your procedure, it is common to have: °Your blood pressure, heart rate, breathing rate, and blood oxygen level monitored until you leave the hospital or clinic. °A burning sensation when you urinate. You may be asked to urinate. °Blood in your urine. °Mild discomfort in your bladder area or kidney area when urinating. °A need to urinate more often or urgently. °Follow these instructions at home: °Medicines °Take over-the-counter and prescription medicines only as told by your health care provider. °If you were prescribed an antibiotic medicine, take it as told by your health care provider. Do not stop taking the antibiotic even if you start to feel better. °General instructions ° °If you were given a sedative during the procedure, it can affect you for several hours. Do not drive or operate machinery until your health care provider says that it is safe. °To relieve burning, take a warm bath or hold a warm washcloth over your groin. °Drink enough fluid to keep your urine pale yellow. °Drink two 8-ounce (237 mL) glasses of water every hour for the first 2 hours after you get home. °Continue to drink water often at home. °You can eat what  you normally do. °Keep all follow-up visits as told by your health care provider. This is important. °If you had a ureteral stent placed, ask your health care provider when you need to return to have it removed. °Contact a health care provider if you have: °Chills or a fever. °Burning pain for longer than 24 hours after the procedure. °Blood in your urine for longer than 24 hours after the procedure. °Get help right away if you have: °Large amounts of blood in your urine. °Blood clots in your urine. °Severe pain. °Chest pain or trouble breathing. °The feeling of a full bladder and you are unable to urinate. °These symptoms may represent a serious problem that is an emergency. Do not wait to see if the symptoms will go away. Get medical help right away. Call your local emergency services (911 in the [place] °Summary °Ureteroscopy is a procedure to check for and treat problems inside part of the urinary tract. °In this procedure, a thin, flexible tube with a light at the end (ureteroscope) is used to look at the inside of the kidneys and the ureters. °You may need this procedure if you have frequent urinary tract infections (UTIs), blood in your urine, or a stone in a ureter. °This information is not intended to replace advice given to   you by your health care provider. Make sure you discuss any questions you have with your health care provider. °Document Revised: 10/29/2020 Document Reviewed: 11/22/2018 °Elsevier Patient Education © 2022 [employer] ° °

## 2021-04-03 NOTE — H&P (View-Only) (Signed)
04/03/2021 9:13 AM   [name] 05-26-1985 [id number]  Referring provider: Jerel Shepherd, [place],  [place] [address]  Right flank pain   HPI: Ms Tiffany Ward is a 36yo here for evaluation of nephrolithiasis. She has a known hx of nephrolithiasis with her last stone event was 5 years ago. Starting 3 weeks ago she developed sharp, intermittent, moderate to severe right flank pin. She has associated nausea but no vomiting. She has new onset urinary frequency, urgency and occasional dysuria. UA is normal. KUB from today shows a right mid ureteral calculus and a left renal calculus. No other complaints today   PMH: Past Medical History:  Diagnosis Date   Deafness in left ear    Diabetes mellitus without complication ([place])    Diverticulitis    Sleep apnea    does not use cpap    Surgical History: Past Surgical History:  Procedure Laterality Date   CESAREAN SECTION     COLONOSCOPY WITH PROPOFOL N/A 12/31/2019   rourk: diverticulosis. next colonoscopy at age 69   [place] Right 10/09/2013   Procedure: LAPAROSCOPIC RIGHT OOPHORECTOMY;  Surgeon: Jonnie Kind, MD;  Location: [facility];  Service: Gynecology;  Laterality: Right;   LT.EAR SURGERY      Home Medications:  Allergies as of 04/03/2021       Reactions   Codeine Itching, Rash        Medication List        Accurate as of April 03, 2021  9:13 AM. If you have any questions, ask your nurse or doctor.          amoxicillin-clavulanate 875-125 MG tablet Commonly known as: Augmentin Take 1 tablet by mouth 2 (two) times daily. One po bid x 7 days   atorvastatin 10 MG tablet Commonly known as: LIPITOR Take 10 mg by mouth at bedtime.   ciprofloxacin-dexamethasone OTIC suspension Commonly known as: Ciprodex Place 4 drops into the left ear 3 (three) times daily.   etonogestrel 68 MG Impl implant Commonly known as: NEXPLANON 68 mg by Subdermal route  once.   metFORMIN 500 MG 24 hr tablet Commonly known as: GLUCOPHAGE-XR Take 500 mg by mouth at bedtime.   metroNIDAZOLE 500 MG tablet Commonly known as: FLAGYL Take 500 mg by mouth 2 (two) times daily.   montelukast 10 MG tablet Commonly known as: SINGULAIR Take 10 mg by mouth at bedtime.   Vitamin D3 1.25 MG (50000 UT) Caps Take 5,000 Units by mouth every 7 (seven) days.        Allergies:  Allergies  Allergen Reactions   Codeine Itching and Rash    Family History: Family History  Problem Relation Age of Onset   Diabetes Mother    Hypertension Mother    Hypertension Father    Hypertension Sister    Hypertension Paternal Grandmother    Hypertension Sister    Diabetes Sister    Diverticulitis Other        Multiple family members   Colon cancer Neg Hx     Social History:  reports that she has never smoked. She has never used smokeless tobacco. She reports that she does not drink alcohol and does not use drugs.  ROS: All other review of systems were reviewed and are negative except what is noted above in HPI  Physical Exam: BP (!) 148/92    Pulse 78   Constitutional:  Alert and oriented, No acute distress. HEENT: [place]  AT, moist mucus membranes.  Trachea midline, no masses. Cardiovascular: No clubbing, cyanosis, or edema. Respiratory: Normal respiratory effort, no increased work of breathing. GI: Abdomen is soft, nontender, nondistended, no abdominal masses GU: No CVA tenderness.  Lymph: No cervical or inguinal lymphadenopathy. Skin: No rashes, bruises or suspicious lesions. Neurologic: Grossly intact, no focal deficits, moving all 4 extremities. Psychiatric: Normal mood and affect.  Laboratory Data: Lab Results  Component Value Date   WBC 13.2 (H) 03/15/2021   HGB 14.5 03/15/2021   HCT 42.9 03/15/2021   MCV 90.7 03/15/2021   PLT 215 03/15/2021    Lab Results  Component Value Date   CREATININE 0.97 03/15/2021    No results found for: PSA  No  results found for: TESTOSTERONE  No results found for: HGBA1C  Urinalysis    Component Value Date/Time   COLORURINE YELLOW (A) 03/15/2021 0450   APPEARANCEUR CLEAR (A) 03/15/2021 0450   LABSPEC >1.030 (H) 03/15/2021 0450   PHURINE 5.5 03/15/2021 0450   GLUCOSEU 250 (A) 03/15/2021 0450   HGBUR MODERATE (A) 03/15/2021 0450   BILIRUBINUR NEGATIVE 03/15/2021 0450   KETONESUR NEGATIVE 03/15/2021 0450   PROTEINUR NEGATIVE 03/15/2021 0450   UROBILINOGEN 0.2 10/03/2013 1100   NITRITE NEGATIVE 03/15/2021 0450   LEUKOCYTESUR NEGATIVE 03/15/2021 0450    Lab Results  Component Value Date   BACTERIA RARE (A) 07/19/2019    Pertinent Imaging: KUb today: Images reviewed and discussed with the patient  No results found for this or any previous visit.  No results found for this or any previous visit.  No results found for this or any previous visit.  No results found for this or any previous visit.  No results found for this or any previous visit.  No results found for this or any previous visit.  No results found for this or any previous visit.  Results for orders placed during the hospital encounter of 07/19/19  CT Renal Stone Study  Narrative CLINICAL DATA:  Flank pain, right  EXAM: CT ABDOMEN AND PELVIS WITHOUT CONTRAST  TECHNIQUE: Multidetector CT imaging of the abdomen and pelvis was performed following the standard protocol without IV contrast.  COMPARISON:  None.  FINDINGS: LOWER CHEST: Normal.  HEPATOBILIARY: Diffuse hypoattenuation of the liver relative to the spleen suggests hepatic steatosis. No focal liver lesion or biliary dilatation. There is cholelithiasis without acute inflammation.  PANCREAS: Normal pancreas. No ductal dilatation or peripancreatic fluid collection.  SPLEEN: Normal.  ADRENALS/URINARY TRACT: The adrenal glands are normal. There is no hydronephrosis. No right nephroureterolithiasis. On the left, there is a 3 mm stone at the renal  pelvis without evidence of obstruction. There is also a 5 mm lower pole stone. The urinary bladder is normal for degree of distention  STOMACH/BOWEL: There is no hiatal hernia. Normal duodenal course and caliber. No small bowel dilatation or inflammation. There is rectosigmoid diverticulosis with acute inflammation. Six no free intraperitoneal air or fluid collection. Normal appendix.  VASCULAR/LYMPHATIC: Normal course and caliber of the major abdominal vessels. No abdominal or pelvic lymphadenopathy.  REPRODUCTIVE: Normal uterus and ovaries.  MUSCULOSKELETAL. No bony spinal canal stenosis or focal osseous abnormality.  OTHER: None.  IMPRESSION: 1. Acute sigmoid diverticulitis without abscess or free intraperitoneal air. 2. Nonobstructive left nephrolithiasis measuring up to 5 mm. 3. No right hydronephrosis or nephroureterolithiasis. 4. Hepatic steatosis and cholelithiasis without acute inflammation.   Electronically Signed By: Ulyses Jarred M.D. On: 07/19/2019 16:22   Assessment & Plan:    1. Kidney  stones -We discussed the management of kidney stones. These options include observation, ureteroscopy, shockwave lithotripsy (ESWL) and percutaneous nephrolithotomy (PCNL). We discussed which options are relevant to the patient's stone(s). We discussed the natural history of kidney stones as well as the complications of untreated stones and the impact on quality of life without treatment as well as with each of the above listed treatments. We also discussed the efficacy of each treatment in its ability to clear the stone burden. With any of these management options I discussed the signs and symptoms of infection and the need for emergent treatment should these be experienced. For each option we discussed the ability of each procedure to clear the patient of their stone burden.   For observation I described the risks which include but are not limited to silent renal damage,  life-threatening infection, need for emergent surgery, failure to pass stone and pain.   For ureteroscopy I described the risks which include bleeding, infection, damage to contiguous structures, positioning injury, ureteral stricture, ureteral avulsion, ureteral injury, need for prolonged ureteral stent, inability to perform ureteroscopy, need for an interval procedure, inability to clear stone burden, stent discomfort/pain, heart attack, stroke, pulmonary embolus and the inherent risks with general anesthesia.   For shockwave lithotripsy I described the risks which include arrhythmia, kidney contusion, kidney hemorrhage, need for transfusion, pain, inability to adequately break up stone, inability to pass stone fragments, Steinstrasse, infection associated with obstructing stones, need for alternate surgical procedure, need for repeat shockwave lithotripsy, MI, CVA, PE and the inherent risks with anesthesia/conscious sedation.   For PCNL I described the risks including positioning injury, pneumothorax, hydrothorax, need for chest tube, inability to clear stone burden, renal laceration, arterial venous fistula or malformation, need for embolization of kidney, loss of kidney or renal function, need for repeat procedure, need for prolonged nephrostomy tube, ureteral avulsion, MI, CVA, PE and the inherent risks of general anesthesia.   - The patient would like to proceed with bilateral ureteroscopic stone extraction - Urinalysis, Routine w reflex microscopic   No follow-ups on file.  Nicolette Bang, MD  [facility] [place]

## 2021-04-03 NOTE — Progress Notes (Signed)
04/03/2021 9:13 AM   Tiffany Ward 1985-09-01 [id number]  Referring provider: Jerel Shepherd, [place],  [place] [address]  Right flank pain   HPI: Tiffany Ward is a 36yo here for evaluation of nephrolithiasis. She has a known hx of nephrolithiasis with her last stone event was 5 years ago. Starting 3 weeks ago she developed sharp, intermittent, moderate to severe right flank pin. She has associated nausea but no vomiting. She has new onset urinary frequency, urgency and occasional dysuria. UA is normal. KUB from today shows a right mid ureteral calculus and a left renal calculus. No other complaints today   PMH: Past Medical History:  Diagnosis Date   Deafness in left ear    Diabetes mellitus without complication ([place])    Diverticulitis    Sleep apnea    does not use cpap    Surgical History: Past Surgical History:  Procedure Laterality Date   CESAREAN SECTION     COLONOSCOPY WITH PROPOFOL N/A 12/31/2019   rourk: diverticulosis. next colonoscopy at age 31   [place] Right 10/09/2013   Procedure: LAPAROSCOPIC RIGHT OOPHORECTOMY;  Surgeon: Jonnie Kind, MD;  Location: [facility];  Service: Gynecology;  Laterality: Right;   LT.EAR SURGERY      Home Medications:  Allergies as of 04/03/2021       Reactions   Codeine Itching, Rash        Medication List        Accurate as of April 03, 2021  9:13 AM. If you have any questions, ask your nurse or doctor.          amoxicillin-clavulanate 875-125 MG tablet Commonly known as: Augmentin Take 1 tablet by mouth 2 (two) times daily. One po bid x 7 days   atorvastatin 10 MG tablet Commonly known as: LIPITOR Take 10 mg by mouth at bedtime.   ciprofloxacin-dexamethasone OTIC suspension Commonly known as: Ciprodex Place 4 drops into the left ear 3 (three) times daily.   etonogestrel 68 MG Impl implant Commonly known as: NEXPLANON 68 mg by Subdermal route  once.   metFORMIN 500 MG 24 hr tablet Commonly known as: GLUCOPHAGE-XR Take 500 mg by mouth at bedtime.   metroNIDAZOLE 500 MG tablet Commonly known as: FLAGYL Take 500 mg by mouth 2 (two) times daily.   montelukast 10 MG tablet Commonly known as: SINGULAIR Take 10 mg by mouth at bedtime.   Vitamin D3 1.25 MG (50000 UT) Caps Take 5,000 Units by mouth every 7 (seven) days.        Allergies:  Allergies  Allergen Reactions   Codeine Itching and Rash    Family History: Family History  Problem Relation Age of Onset   Diabetes Mother    Hypertension Mother    Hypertension Father    Hypertension Sister    Hypertension Paternal Grandmother    Hypertension Sister    Diabetes Sister    Diverticulitis Other        Multiple family members   Colon cancer Neg Hx     Social History:  reports that she has never smoked. She has never used smokeless tobacco. She reports that she does not drink alcohol and does not use drugs.  ROS: All other review of systems were reviewed and are negative except what is noted above in HPI  Physical Exam: BP (!) 148/92    Pulse 78   Constitutional:  Alert and oriented, No acute distress. HEENT: [place]  AT, moist mucus membranes.  Trachea midline, no masses. Cardiovascular: No clubbing, cyanosis, or edema. Respiratory: Normal respiratory effort, no increased work of breathing. GI: Abdomen is soft, nontender, nondistended, no abdominal masses GU: No CVA tenderness.  Lymph: No cervical or inguinal lymphadenopathy. Skin: No rashes, bruises or suspicious lesions. Neurologic: Grossly intact, no focal deficits, moving all 4 extremities. Psychiatric: Normal mood and affect.  Laboratory Data: Lab Results  Component Value Date   WBC 13.2 (H) 03/15/2021   HGB 14.5 03/15/2021   HCT 42.9 03/15/2021   MCV 90.7 03/15/2021   PLT 215 03/15/2021    Lab Results  Component Value Date   CREATININE 0.97 03/15/2021    No results found for: PSA  No  results found for: TESTOSTERONE  No results found for: HGBA1C  Urinalysis    Component Value Date/Time   COLORURINE YELLOW (A) 03/15/2021 0450   APPEARANCEUR CLEAR (A) 03/15/2021 0450   LABSPEC >1.030 (H) 03/15/2021 0450   PHURINE 5.5 03/15/2021 0450   GLUCOSEU 250 (A) 03/15/2021 0450   HGBUR MODERATE (A) 03/15/2021 0450   BILIRUBINUR NEGATIVE 03/15/2021 0450   KETONESUR NEGATIVE 03/15/2021 0450   PROTEINUR NEGATIVE 03/15/2021 0450   UROBILINOGEN 0.2 10/03/2013 1100   NITRITE NEGATIVE 03/15/2021 0450   LEUKOCYTESUR NEGATIVE 03/15/2021 0450    Lab Results  Component Value Date   BACTERIA RARE (A) 07/19/2019    Pertinent Imaging: KUb today: Images reviewed and discussed with the patient  No results found for this or any previous visit.  No results found for this or any previous visit.  No results found for this or any previous visit.  No results found for this or any previous visit.  No results found for this or any previous visit.  No results found for this or any previous visit.  No results found for this or any previous visit.  Results for orders placed during the hospital encounter of 07/19/19  CT Renal Stone Study  Narrative CLINICAL DATA:  Flank pain, right  EXAM: CT ABDOMEN AND PELVIS WITHOUT CONTRAST  TECHNIQUE: Multidetector CT imaging of the abdomen and pelvis was performed following the standard protocol without IV contrast.  COMPARISON:  None.  FINDINGS: LOWER CHEST: Normal.  HEPATOBILIARY: Diffuse hypoattenuation of the liver relative to the spleen suggests hepatic steatosis. No focal liver lesion or biliary dilatation. There is cholelithiasis without acute inflammation.  PANCREAS: Normal pancreas. No ductal dilatation or peripancreatic fluid collection.  SPLEEN: Normal.  ADRENALS/URINARY TRACT: The adrenal glands are normal. There is no hydronephrosis. No right nephroureterolithiasis. On the left, there is a 3 mm stone at the renal  pelvis without evidence of obstruction. There is also a 5 mm lower pole stone. The urinary bladder is normal for degree of distention  STOMACH/BOWEL: There is no hiatal hernia. Normal duodenal course and caliber. No small bowel dilatation or inflammation. There is rectosigmoid diverticulosis with acute inflammation. Six no free intraperitoneal air or fluid collection. Normal appendix.  VASCULAR/LYMPHATIC: Normal course and caliber of the major abdominal vessels. No abdominal or pelvic lymphadenopathy.  REPRODUCTIVE: Normal uterus and ovaries.  MUSCULOSKELETAL. No bony spinal canal stenosis or focal osseous abnormality.  OTHER: None.  IMPRESSION: 1. Acute sigmoid diverticulitis without abscess or free intraperitoneal air. 2. Nonobstructive left nephrolithiasis measuring up to 5 mm. 3. No right hydronephrosis or nephroureterolithiasis. 4. Hepatic steatosis and cholelithiasis without acute inflammation.   Electronically Signed By: Ulyses Jarred M.D. On: 07/19/2019 16:22   Assessment & Plan:    1. Kidney  stones -We discussed the management of kidney stones. These options include observation, ureteroscopy, shockwave lithotripsy (ESWL) and percutaneous nephrolithotomy (PCNL). We discussed which options are relevant to the patient's stone(s). We discussed the natural history of kidney stones as well as the complications of untreated stones and the impact on quality of life without treatment as well as with each of the above listed treatments. We also discussed the efficacy of each treatment in its ability to clear the stone burden. With any of these management options I discussed the signs and symptoms of infection and the need for emergent treatment should these be experienced. For each option we discussed the ability of each procedure to clear the patient of their stone burden.   For observation I described the risks which include but are not limited to silent renal damage,  life-threatening infection, need for emergent surgery, failure to pass stone and pain.   For ureteroscopy I described the risks which include bleeding, infection, damage to contiguous structures, positioning injury, ureteral stricture, ureteral avulsion, ureteral injury, need for prolonged ureteral stent, inability to perform ureteroscopy, need for an interval procedure, inability to clear stone burden, stent discomfort/pain, heart attack, stroke, pulmonary embolus and the inherent risks with general anesthesia.   For shockwave lithotripsy I described the risks which include arrhythmia, kidney contusion, kidney hemorrhage, need for transfusion, pain, inability to adequately break up stone, inability to pass stone fragments, Steinstrasse, infection associated with obstructing stones, need for alternate surgical procedure, need for repeat shockwave lithotripsy, MI, CVA, PE and the inherent risks with anesthesia/conscious sedation.   For PCNL I described the risks including positioning injury, pneumothorax, hydrothorax, need for chest tube, inability to clear stone burden, renal laceration, arterial venous fistula or malformation, need for embolization of kidney, loss of kidney or renal function, need for repeat procedure, need for prolonged nephrostomy tube, ureteral avulsion, MI, CVA, PE and the inherent risks of general anesthesia.   - The patient would like to proceed with bilateral ureteroscopic stone extraction - Urinalysis, Routine w reflex microscopic   No follow-ups on file.  Nicolette Bang, MD  [facility] [place]

## 2021-04-03 NOTE — Progress Notes (Signed)
Urological Symptom Review  Patient is experiencing the following symptoms: Frequent urination Hard to postpone urination Blood in urine Kidney stones   Review of Systems  Gastrointestinal (upper)  : Nausea Vomiting  Gastrointestinal (lower) : Negative for lower GI symptoms  Constitutional : Negative for symptoms  Skin: Negative for skin symptoms  Eyes: Negative for eye symptoms  Ear/Nose/Throat : Negative for Ear/Nose/Throat symptoms  Hematologic/Lymphatic: Negative for Hematologic/Lymphatic symptoms  Cardiovascular : Negative for cardiovascular symptoms  Respiratory : Negative for respiratory symptoms  Endocrine: Negative for endocrine symptoms  Musculoskeletal: Negative for musculoskeletal symptoms  Neurological: Negative for neurological symptoms  Psychologic: Negative for psychiatric symptoms

## 2021-04-06 NOTE — Patient Instructions (Addendum)
Your procedure is scheduled on: 04/09/2021  Report to [facility] at   10:15  AM.  Call this number if you have problems the morning of surgery: [phone]   Remember:   Do not Eat or Drink after midnight         No Smoking the morning of surgery No diabetic medications am of procedure  :  Take these medicines the morning of surgery with A SIP OF WATER: none   Do not wear jewelry, make-up or nail polish.  Do not wear lotions, powders, or perfumes. You may wear deodorant.  Do not shave 48 hours prior to surgery. Men may shave face and neck.  Do not bring valuables to the hospital.  Contacts, dentures or bridgework may not be worn into surgery.  Leave suitcase in the car. After surgery it may be brought to your room.  For patients admitted to the hospital, checkout time is 11:00 AM the day of discharge.   Patients discharged the day of surgery will not be allowed to drive home.    Special Instructions: Shower using CHG night before surgery and shower the day of surgery use CHG.  Use special wash - you have one bottle of CHG for all showers.  You should use approximately 1/2 of the bottle for each shower.  How to Use Chlorhexidine for Bathing Chlorhexidine gluconate (CHG) is a germ-killing (antiseptic) solution that is used to clean the skin. It can get rid of the bacteria that normally live on the skin and can keep them away for about 24 hours. To clean your skin with CHG, you may be given: A CHG solution to use in the shower or as part of a sponge bath. A prepackaged cloth that contains CHG. Cleaning your skin with CHG may help lower the risk for infection: While you are staying in the intensive care unit of the hospital. If you have a vascular access, such as a central line, to provide short-term or long-term access to your veins. If you have a catheter to drain urine from your bladder. If you are on a ventilator. A ventilator is a machine that helps you breathe by  moving air in and out of your lungs. After surgery. What are the risks? Risks of using CHG include: A skin reaction. Hearing loss, if CHG gets in your ears and you have a perforated eardrum. Eye injury, if CHG gets in your eyes and is not rinsed out. The CHG product catching fire. Make sure that you avoid smoking and flames after applying CHG to your skin. Do not use CHG: If you have a chlorhexidine allergy or have previously reacted to chlorhexidine. On babies younger than 76 months of age. How to use CHG solution Use CHG only as told by your health care provider, and follow the instructions on the label. Use the full amount of CHG as directed. Usually, this is one bottle. During a shower Follow these steps when using CHG solution during a shower (unless your health care provider gives you different instructions): Start the shower. Use your normal soap and shampoo to wash your face and hair. Turn off the shower or move out of the shower stream. Pour the CHG onto a clean washcloth. Do not use any type of brush or rough-edged sponge. Starting at your neck, lather your body down to your toes. Make sure you follow these instructions: If you will be having surgery, pay special attention to the part of your body where  you will be having surgery. Scrub this area for at least 1 minute. Do not use CHG on your head or face. If the solution gets into your ears or eyes, rinse them well with water. Avoid your genital area. Avoid any areas of skin that have broken skin, cuts, or scrapes. Scrub your back and under your arms. Make sure to wash skin folds. Let the lather sit on your skin for 1-2 minutes or as long as told by your health care provider. Thoroughly rinse your entire body in the shower. Make sure that all body creases and crevices are rinsed well. Dry off with a clean towel. Do not put any substances on your body afterward--such as powder, lotion, or perfume--unless you are told to do so by  your health care provider. Only use lotions that are recommended by the manufacturer. Put on clean clothes or pajamas. If it is the night before your surgery, sleep in clean sheets.  During a sponge bath Follow these steps when using CHG solution during a sponge bath (unless your health care provider gives you different instructions): Use your normal soap and shampoo to wash your face and hair. Pour the CHG onto a clean washcloth. Starting at your neck, lather your body down to your toes. Make sure you follow these instructions: If you will be having surgery, pay special attention to the part of your body where you will be having surgery. Scrub this area for at least 1 minute. Do not use CHG on your head or face. If the solution gets into your ears or eyes, rinse them well with water. Avoid your genital area. Avoid any areas of skin that have broken skin, cuts, or scrapes. Scrub your back and under your arms. Make sure to wash skin folds. Let the lather sit on your skin for 1-2 minutes or as long as told by your health care provider. Using a different clean, wet washcloth, thoroughly rinse your entire body. Make sure that all body creases and crevices are rinsed well. Dry off with a clean towel. Do not put any substances on your body afterward--such as powder, lotion, or perfume--unless you are told to do so by your health care provider. Only use lotions that are recommended by the manufacturer. Put on clean clothes or pajamas. If it is the night before your surgery, sleep in clean sheets. How to use CHG prepackaged cloths Only use CHG cloths as told by your health care provider, and follow the instructions on the label. Use the CHG cloth on clean, dry skin. Do not use the CHG cloth on your head or face unless your health care provider tells you to. When washing with the CHG cloth: Avoid your genital area. Avoid any areas of skin that have broken skin, cuts, or scrapes. Before  surgery Follow these steps when using a CHG cloth to clean before surgery (unless your health care provider gives you different instructions): Using the CHG cloth, vigorously scrub the part of your body where you will be having surgery. Scrub using a back-and-forth motion for 3 minutes. The area on your body should be completely wet with CHG when you are done scrubbing. Do not rinse. Discard the cloth and let the area air-dry. Do not put any substances on the area afterward, such as powder, lotion, or perfume. Put on clean clothes or pajamas. If it is the night before your surgery, sleep in clean sheets.  For general bathing Follow these steps when using CHG cloths for general  bathing (unless your health care provider gives you different instructions). Use a separate CHG cloth for each area of your body. Make sure you wash between any folds of skin and between your fingers and toes. Wash your body in the following order, switching to a new cloth after each step: The front of your neck, shoulders, and chest. Both of your arms, under your arms, and your hands. Your stomach and groin area, avoiding the genitals. Your right leg and foot. Your left leg and foot. The back of your neck, your back, and your buttocks. Do not rinse. Discard the cloth and let the area air-dry. Do not put any substances on your body afterward--such as powder, lotion, or perfume--unless you are told to do so by your health care provider. Only use lotions that are recommended by the manufacturer. Put on clean clothes or pajamas. Contact a health care provider if: Your skin gets irritated after scrubbing. You have questions about using your solution or cloth. You swallow any chlorhexidine. Call your local poison control center (1-816-185-3857 in the [place] Get help right away if: Your eyes itch badly, or they become very red or swollen. Your skin itches badly and is red or swollen. Your hearing changes. You have trouble  seeing. You have swelling or tingling in your mouth or throat. You have trouble breathing. These symptoms may represent a serious problem that is an emergency. Do not wait to see if the symptoms will go away. Get medical help right away. Call your local emergency services (911 in the [place] Do not drive yourself to the hospital. Summary Chlorhexidine gluconate (CHG) is a germ-killing (antiseptic) solution that is used to clean the skin. Cleaning your skin with CHG may help to lower your risk for infection. You may be given CHG to use for bathing. It may be in a bottle or in a prepackaged cloth to use on your skin. Carefully follow your health care provider's instructions and the instructions on the product label. Do not use CHG if you have a chlorhexidine allergy. Contact your health care provider if your skin gets irritated after scrubbing. This information is not intended to replace advice given to you by your health care provider. Make sure you discuss any questions you have with your health care provider. Document Revised: 04/28/2020 Document Reviewed: 04/28/2020 Elsevier Patient Education  2022 [place]. Cystoscopy Cystoscopy is a procedure that is used to help diagnose and sometimes treat conditions that affect the lower urinary tract. The lower urinary tract includes the bladder and the urethra. The urethra is the tube that drains urine from the bladder. Cystoscopy is done using a thin, tube-shaped instrument with a light and camera at the end (cystoscope). The cystoscope may be hard or flexible, depending on the goal of the procedure. The cystoscope is inserted through the urethra, into the bladder. Cystoscopy may be recommended if you have: Urinary tract infections that keep coming back. Blood in the urine (hematuria). An inability to control when you urinate (urinary incontinence) or an overactive bladder. Unusual cells found in a urine sample. A blockage in the urethra, such as a  urinary stone. Painful urination. An abnormality in the bladder found during an intravenous pyelogram (IVP) or CT scan. Cystoscopy may also be done to remove a sample of tissue to be examined under a microscope (biopsy). Tell a health care provider about: Any allergies you have. All medicines you are taking, including vitamins, herbs, eye drops, creams, and over-the-counter medicines. Any problems you or  family members have had with anesthetic medicines. Any blood disorders you have. Any surgeries you have had. Any medical conditions you have. Whether you are pregnant or may be pregnant. What are the risks? Generally, this is a safe procedure. However, problems may occur, including: Infection. Bleeding. Allergic reactions to medicines. Damage to other structures or organs. What happens before the procedure? Medicines Ask your health care provider about: Changing or stopping your regular medicines. This is especially important if you are taking diabetes medicines or blood thinners. Taking medicines such as aspirin and ibuprofen. These medicines can thin your blood. Do not take these medicines unless your health care provider tells you to take them. Taking over-the-counter medicines, vitamins, herbs, and supplements. Tests You may have an exam or testing, such as: X-rays of the bladder, urethra, or kidneys. CT scan of the abdomen or pelvis. Urine tests to check for signs of infection. General instructions Follow instructions from your health care provider about eating or drinking restrictions. Ask your health care provider what steps will be taken to help prevent infection. These steps may include: Washing skin with a germ-killing soap. Taking antibiotic medicine. Plan to have a responsible adult take you home from the hospital or clinic. What happens during the procedure?  You will be given one or more of the following: A medicine to help you relax (sedative). A medicine to numb  the area (local anesthetic). The area around the opening of your urethra will be cleaned. The cystoscope will be passed through your urethra into your bladder. Germ-free (sterile) fluid will flow through the cystoscope to fill your bladder. The fluid will stretch your bladder so that your health care provider can clearly examine your bladder walls. Your doctor will look at the urethra and bladder. Your doctor may take a biopsy or remove stones. The cystoscope will be removed, and your bladder will be emptied. The procedure may vary among health care providers and hospitals. What can I expect after the procedure? After the procedure, it is common to have: Some soreness or pain in your abdomen and urethra. Urinary symptoms. These include: Mild pain or burning when you urinate. Pain should stop within a few minutes after you urinate. This may last for up to 1 week. A small amount of blood in your urine for several days. Feeling like you need to urinate but producing only a small amount of urine. Follow these instructions at home: Medicines Take over-the-counter and prescription medicines only as told by your health care provider. If you were prescribed an antibiotic medicine, take it as told by your health care provider. Do not stop taking the antibiotic even if you start to feel better. General instructions Return to your normal activities as told by your health care provider. Ask your health care provider what activities are safe for you. If you were given a sedative during the procedure, it can affect you for several hours. Do not drive or operate machinery until your health care provider says that it is safe. Watch for any blood in your urine. If the amount of blood in your urine increases, call your health care provider. Follow instructions from your health care provider about eating or drinking restrictions. If a tissue sample was removed for testing (biopsy) during your procedure, it is up  to you to get your test results. Ask your health care provider, or the department that is doing the test, when your results will be ready. Drink enough fluid to keep your urine pale yellow.  Keep all follow-up visits. This is important. Contact a health care provider if: You have pain that gets worse or does not get better with medicine, especially pain when you urinate. You have trouble urinating. You have more blood in your urine. Get help right away if: You have blood clots in your urine. You have abdominal pain. You have a fever or chills. You are unable to urinate. Summary Cystoscopy is a procedure that is used to help diagnose and sometimes treat conditions that affect the lower urinary tract. Cystoscopy is done using a thin, tube-shaped instrument with a light and camera at the end. After the procedure, it is common to have some soreness or pain in your abdomen and urethra. Watch for any blood in your urine. If the amount of blood in your urine increases, call your health care provider. If you were prescribed an antibiotic medicine, take it as told by your health care provider. Do not stop taking the antibiotic even if you start to feel better. This information is not intended to replace advice given to you by your health care provider. Make sure you discuss any questions you have with your health care provider. Document Revised: 10/29/2020 Document Reviewed: 09/28/2019 Elsevier Patient Education  [place] Anesthesia, Adult, Care After This sheet gives you information about how to care for yourself after your procedure. Your health care provider may also give you more specific instructions. If you have problems or questions, contact your health care provider. What can I expect after the procedure? After the procedure, the following side effects are common: Pain or discomfort at the IV site. Nausea. Vomiting. Sore throat. Trouble concentrating. Feeling cold or  chills. Feeling weak or tired. Sleepiness and fatigue. Soreness and body aches. These side effects can affect parts of the body that were not involved in surgery. Follow these instructions at home: For the time period you were told by your health care provider:  Rest. Do not participate in activities where you could fall or become injured. Do not drive or use machinery. Do not drink alcohol. Do not take sleeping pills or medicines that cause drowsiness. Do not make important decisions or sign legal documents. Do not take care of children on your own. Eating and drinking Follow any instructions from your health care provider about eating or drinking restrictions. When you feel hungry, start by eating small amounts of foods that are soft and easy to digest (bland), such as toast. Gradually return to your regular diet. Drink enough fluid to keep your urine pale yellow. If you vomit, rehydrate by drinking water, juice, or clear broth. General instructions If you have sleep apnea, surgery and certain medicines can increase your risk for breathing problems. Follow instructions from your health care provider about wearing your sleep device: Anytime you are sleeping, including during daytime naps. While taking prescription pain medicines, sleeping medicines, or medicines that make you drowsy. Have a responsible adult stay with you for the time you are told. It is important to have someone help care for you until you are awake and alert. Return to your normal activities as told by your health care provider. Ask your health care provider what activities are safe for you. Take over-the-counter and prescription medicines only as told by your health care provider. If you smoke, do not smoke without supervision. Keep all follow-up visits as told by your health care provider. This is important. Contact a health care provider if: You have nausea or vomiting that does  not get better with medicine. You  cannot eat or drink without vomiting. You have pain that does not get better with medicine. You are unable to pass urine. You develop a skin rash. You have a fever. You have redness around your IV site that gets worse. Get help right away if: You have difficulty breathing. You have chest pain. You have blood in your urine or stool, or you vomit blood. Summary After the procedure, it is common to have a sore throat or nausea. It is also common to feel tired. Have a responsible adult stay with you for the time you are told. It is important to have someone help care for you until you are awake and alert. When you feel hungry, start by eating small amounts of foods that are soft and easy to digest (bland), such as toast. Gradually return to your regular diet. Drink enough fluid to keep your urine pale yellow. Return to your normal activities as told by your health care provider. Ask your health care provider what activities are safe for you. This information is not intended to replace advice given to you by your health care provider. Make sure you discuss any questions you have with your health care provider. Document Revised: 11/01/2019 Document Reviewed: 05/31/2019 Elsevier Patient Education  2022 [employer].

## 2021-04-07 ENCOUNTER — Encounter (HOSPITAL_COMMUNITY)
Admission: RE | Admit: 2021-04-07 | Discharge: 2021-04-07 | Disposition: A | Discharge status: Home / Self Care | Payer: Medicaid Other | Source: Ambulatory Visit | Attending: Urology | Admitting: Urology

## 2021-04-07 ENCOUNTER — Other Ambulatory Visit: Payer: Self-pay

## 2021-04-07 VITALS — BP 140/66 | HR 65 | Temp 97.7°F | Resp 18 | Ht 68.05 in | Wt 290.0 lb

## 2021-04-07 DIAGNOSIS — Z01818 Encounter for other preprocedural examination: Secondary | ICD-10-CM | POA: Diagnosis not present

## 2021-04-07 DIAGNOSIS — K760 Fatty (change of) liver, not elsewhere classified: Secondary | ICD-10-CM

## 2021-04-07 LAB — PREGNANCY, URINE: Preg Test, Ur: NEGATIVE

## 2021-04-09 ENCOUNTER — Ambulatory Visit (HOSPITAL_BASED_OUTPATIENT_CLINIC_OR_DEPARTMENT_OTHER): Payer: Medicaid Other | Admitting: Anesthesiology

## 2021-04-09 ENCOUNTER — Encounter (HOSPITAL_COMMUNITY): Payer: Self-pay | Admitting: Urology

## 2021-04-09 ENCOUNTER — Encounter (HOSPITAL_COMMUNITY)
Admission: RE | Disposition: A | Discharge status: Home / Self Care | Payer: Self-pay | Source: Home / Self Care | Attending: Urology

## 2021-04-09 ENCOUNTER — Ambulatory Visit (HOSPITAL_COMMUNITY): Payer: Medicaid Other | Admitting: Anesthesiology

## 2021-04-09 ENCOUNTER — Ambulatory Visit (HOSPITAL_COMMUNITY)
Admission: RE | Admit: 2021-04-09 | Discharge: 2021-04-09 | Disposition: A | Discharge status: Home / Self Care | Payer: Medicaid Other | Attending: Urology | Admitting: Urology

## 2021-04-09 ENCOUNTER — Ambulatory Visit (HOSPITAL_COMMUNITY): Payer: Medicaid Other

## 2021-04-09 DIAGNOSIS — N202 Calculus of kidney with calculus of ureter: Secondary | ICD-10-CM | POA: Diagnosis not present

## 2021-04-09 DIAGNOSIS — N1330 Unspecified hydronephrosis: Secondary | ICD-10-CM | POA: Diagnosis not present

## 2021-04-09 DIAGNOSIS — E119 Type 2 diabetes mellitus without complications: Secondary | ICD-10-CM | POA: Diagnosis not present

## 2021-04-09 DIAGNOSIS — N200 Calculus of kidney: Secondary | ICD-10-CM | POA: Diagnosis not present

## 2021-04-09 DIAGNOSIS — Z6841 Body Mass Index (BMI) 40.0 and over, adult: Secondary | ICD-10-CM | POA: Insufficient documentation

## 2021-04-09 HISTORY — PX: CYSTOSCOPY/RETROGRADE/URETEROSCOPY: SHX5316

## 2021-04-09 HISTORY — PX: CYSTOSCOPY WITH RETROGRADE PYELOGRAM, URETEROSCOPY AND STENT PLACEMENT: SHX5789

## 2021-04-09 LAB — GLUCOSE, CAPILLARY
Glucose-Capillary: 149 mg/dL — ABNORMAL HIGH (ref 70–99)
Glucose-Capillary: 123 mg/dL — ABNORMAL HIGH (ref 70–99)

## 2021-04-09 SURGERY — CYSTOURETEROSCOPY, WITH RETROGRADE PYELOGRAM AND STENT INSERTION
Anesthesia: General | Site: Ureter | Laterality: Right

## 2021-04-09 MED ORDER — LACTATED RINGERS IV SOLN
INTRAVENOUS | Status: DC
  Administered 2021-04-09: 1000 mL via INTRAVENOUS

## 2021-04-09 MED ORDER — MIDAZOLAM HCL 5 MG/5ML IJ SOLN
INTRAMUSCULAR | Status: DC | PRN
Start: 2021-04-09 — End: 2021-04-09
  Administered 2021-04-09: 2 mg via INTRAVENOUS

## 2021-04-09 MED ORDER — PROPOFOL 10 MG/ML IV BOLUS
INTRAVENOUS | Status: AC
  Filled 2021-04-09: qty 20

## 2021-04-09 MED ORDER — DIATRIZOATE MEGLUMINE 30 % UR SOLN
URETHRAL | Status: AC
  Filled 2021-04-09: qty 100

## 2021-04-09 MED ORDER — SUFENTANIL CITRATE 50 MCG/ML IV SOLN
INTRAVENOUS | Status: AC
  Filled 2021-04-09: qty 1

## 2021-04-09 MED ORDER — LIDOCAINE HCL (PF) 2 % IJ SOLN
INTRAMUSCULAR | Status: AC
  Filled 2021-04-09: qty 5

## 2021-04-09 MED ORDER — ONDANSETRON HCL 4 MG/2ML IJ SOLN
INTRAMUSCULAR | Status: DC | PRN
  Administered 2021-04-09: 4 mg via INTRAVENOUS

## 2021-04-09 MED ORDER — ONDANSETRON HCL 4 MG PO TABS: 4.0000 mg | ORAL_TABLET | Freq: Every day | ORAL | 1 refills | Status: DC | PRN

## 2021-04-09 MED ORDER — OXYCODONE-ACETAMINOPHEN 5-325 MG PO TABS: 1.0000 | ORAL_TABLET | ORAL | 0 refills | Status: DC | PRN

## 2021-04-09 MED ORDER — SUFENTANIL CITRATE 50 MCG/ML IV SOLN
INTRAVENOUS | Status: DC | PRN
  Administered 2021-04-09 (×2): 5 ug via INTRAVENOUS

## 2021-04-09 MED ORDER — LIDOCAINE 2% (20 MG/ML) 5 ML SYRINGE
INTRAMUSCULAR | Status: DC | PRN
  Administered 2021-04-09: 100 mg via INTRAVENOUS

## 2021-04-09 MED ORDER — MIDAZOLAM HCL 2 MG/2ML IJ SOLN
INTRAMUSCULAR | Status: AC
  Filled 2021-04-09: qty 2

## 2021-04-09 MED ORDER — CEFAZOLIN IN SODIUM CHLORIDE 3-0.9 GM/100ML-% IV SOLN
3.0000 g | INTRAVENOUS | Status: AC
  Administered 2021-04-09: 3 g via INTRAVENOUS
  Filled 2021-04-09: qty 100

## 2021-04-09 MED ORDER — SODIUM CHLORIDE (PF) 0.9 % IJ SOLN
INTRAMUSCULAR | Status: AC
  Filled 2021-04-09: qty 10

## 2021-04-09 MED ORDER — WATER FOR IRRIGATION, STERILE IR SOLN
Status: DC | PRN
  Administered 2021-04-09: 500 mL

## 2021-04-09 MED ORDER — PROPOFOL 10 MG/ML IV BOLUS
INTRAVENOUS | Status: DC | PRN
Start: 2021-04-09 — End: 2021-04-09
  Administered 2021-04-09: 200 mg via INTRAVENOUS

## 2021-04-09 MED ORDER — DEXAMETHASONE SODIUM PHOSPHATE 10 MG/ML IJ SOLN
INTRAMUSCULAR | Status: DC | PRN
Start: 2021-04-09 — End: 2021-04-09
  Administered 2021-04-09: 10 mg via INTRAVENOUS

## 2021-04-09 MED ORDER — ORAL CARE MOUTH RINSE: 15.0000 mL | Freq: Once | OROMUCOSAL | Status: AC

## 2021-04-09 MED ORDER — ONDANSETRON HCL 4 MG/2ML IJ SOLN
4.0000 mg | Freq: Once | INTRAMUSCULAR | Status: AC | PRN
  Administered 2021-04-09: 4 mg via INTRAVENOUS
  Filled 2021-04-09: qty 2

## 2021-04-09 MED ORDER — CHLORHEXIDINE GLUCONATE 0.12 % MT SOLN
15.0000 mL | Freq: Once | OROMUCOSAL | Status: AC
  Administered 2021-04-09: 15 mL via OROMUCOSAL

## 2021-04-09 MED ORDER — DIATRIZOATE MEGLUMINE 30 % UR SOLN
URETHRAL | Status: DC | PRN
  Administered 2021-04-09: 16 mL via URETHRAL

## 2021-04-09 MED ORDER — SODIUM CHLORIDE 0.9 % IR SOLN
Status: DC | PRN
  Administered 2021-04-09 (×2): 3000 mL

## 2021-04-09 MED ORDER — FENTANYL CITRATE PF 50 MCG/ML IJ SOSY: 25.0000 ug | PREFILLED_SYRINGE | INTRAMUSCULAR | Status: DC | PRN

## 2021-04-09 SURGICAL SUPPLY — 21 items
BAG DRAIN URO TABLE W/ADPT NS (BAG) ×4 IMPLANT
BAG DRN 8 ADPR NS SKTRN CSTL (BAG) ×3
BAG HAMPER (MISCELLANEOUS) ×4 IMPLANT
CATH INTERMIT  6FR 70CM (CATHETERS) ×4 IMPLANT
CLOTH BEACON ORANGE TIMEOUT ST (SAFETY) ×4 IMPLANT
EXTRACTOR STONE NITINOL NGAGE (UROLOGICAL SUPPLIES) ×2 IMPLANT
GLOVE SURG POLYISO LF SZ8 (GLOVE) ×4 IMPLANT
GLOVE SURG UNDER POLY LF SZ7 (GLOVE) ×8 IMPLANT
GOWN STRL REUS W/TWL LRG LVL3 (GOWN DISPOSABLE) ×4 IMPLANT
GOWN STRL REUS W/TWL XL LVL3 (GOWN DISPOSABLE) ×4 IMPLANT
GUIDEWIRE STR DUAL SENSOR (WIRE) ×4 IMPLANT
GUIDEWIRE STR ZIPWIRE 035X150 (MISCELLANEOUS) ×4 IMPLANT
IV NS IRRIG 3000ML ARTHROMATIC (IV SOLUTION) ×8 IMPLANT
KIT TURNOVER CYSTO (KITS) ×4 IMPLANT
MANIFOLD NEPTUNE II (INSTRUMENTS) ×4 IMPLANT
PACK CYSTO (CUSTOM PROCEDURE TRAY) ×4 IMPLANT
PAD ARMBOARD 7.5X6 YLW CONV (MISCELLANEOUS) ×4 IMPLANT
STENT URET 6FRX26 CONTOUR (STENTS) ×2 IMPLANT
SYR CONTROL 10ML LL (SYRINGE) ×4 IMPLANT
TOWEL OR 17X26 4PK STRL BLUE (TOWEL DISPOSABLE) ×4 IMPLANT
WATER STERILE IRR 500ML POUR (IV SOLUTION) ×4 IMPLANT

## 2021-04-09 NOTE — Anesthesia Procedure Notes (Signed)
Procedure Name: LMA Insertion Date/Time: 04/09/2021 8:53 AM Performed by: Myna Bright, CRNA Pre-anesthesia Checklist: Patient identified, Emergency Drugs available, Suction available and Patient being monitored Patient Re-evaluated:Patient Re-evaluated prior to induction Oxygen Delivery Method: Circle system utilized Preoxygenation: Pre-oxygenation with 100% oxygen Induction Type: IV induction Ventilation: Mask ventilation without difficulty LMA: LMA inserted LMA Size: 4.0 Number of attempts: 1 Placement Confirmation: positive ETCO2 and breath sounds checked- equal and bilateral Tube secured with: Tape Dental Injury: Teeth and Oropharynx as per pre-operative assessment

## 2021-04-09 NOTE — Transfer of Care (Signed)
Immediate Anesthesia Transfer of Care Note  Patient: Mattie Nordell  Procedure(s) Performed: CYSTOSCOPY WITH RETROGRADE PYELOGRAM, URETEROSCOPY WITH STENT PLACEMENT (Left: Ureter) CYSTOSCOPY/RETROGRADE/URETEROSCOPY (Right: Ureter)  Patient Location: PACU  Anesthesia Type:General  Level of Consciousness: sedated and patient cooperative  Airway & Oxygen Therapy: Patient Spontanous Breathing and Patient connected to face mask oxygen  Post-op Assessment: Report given to RN, Post -op Vital signs reviewed and stable and Patient moving all extremities  Post vital signs: Reviewed and stable  Last Vitals:  Vitals Value Taken Time  BP 128/69 04/09/21 0945  Temp    Pulse 80 04/09/21 0947  Resp 22 04/09/21 0947  SpO2 100 % 04/09/21 0947  Vitals shown include unvalidated device data.  Last Pain:  Vitals:   04/09/21 0752  TempSrc: Oral  PainSc: 0-No pain      Patients Stated Pain Goal: 9 (68/37/29 0211)  Complications: No notable events documented.

## 2021-04-09 NOTE — Anesthesia Preprocedure Evaluation (Signed)
Anesthesia Evaluation  Patient identified by MRN, date of birth, ID band Patient awake    Reviewed: Allergy & Precautions, H&P , NPO status , Patient's Chart, lab work & pertinent test results, reviewed documented beta blocker date and time   Airway Mallampati: II  TM Distance: >3 FB Neck ROM: full    Dental no notable dental hx.    Pulmonary neg pulmonary ROS,    Pulmonary exam normal breath sounds clear to auscultation       Cardiovascular Exercise Tolerance: Good negative cardio ROS   Rhythm:regular Rate:Normal     Neuro/Psych negative neurological ROS  negative psych ROS   GI/Hepatic negative GI ROS, Neg liver ROS,   Endo/Other  diabetes, Type 2Morbid obesity  Renal/GU negative Renal ROS  negative genitourinary   Musculoskeletal   Abdominal   Peds  Hematology negative hematology ROS (+)   Anesthesia Other Findings   Reproductive/Obstetrics negative OB ROS                             Anesthesia Physical Anesthesia Plan  ASA: 3  Anesthesia Plan: General   Post-op Pain Management:    Induction:   PONV Risk Score and Plan:   Airway Management Planned:   Additional Equipment:   Intra-op Plan:   Post-operative Plan:   Informed Consent: I have reviewed the patients History and Physical, chart, labs and discussed the procedure including the risks, benefits and alternatives for the proposed anesthesia with the patient or authorized representative who has indicated his/her understanding and acceptance.     Dental Advisory Given  Plan Discussed with: CRNA  Anesthesia Plan Comments:         Anesthesia Quick Evaluation

## 2021-04-09 NOTE — Op Note (Signed)
Preoperative diagnosis: right ureteral calculus, left renal calculus  Postoperative diagnosis: Same  Procedure: 1 cystoscopy 2. Bilateral retrograde pyelography 3.  Intraoperative fluoroscopy, under one hour, with interpretation 4.  Left ureteroscopic stone manipulation with basket extraction 5.  Left 6 x 26 JJ stent placement 6. Right diagnostic ureteroscopy  Attending: Rosie Fate  Anesthesia: General  Estimated blood loss: None  Drains: Left 6 x 26 JJ ureteral stent with tether  Specimens: stone for analysis  Antibiotics: ancef  Findings: No right ureteral calculi. N right hydronephrosis. left lower pole stone. No left hydronephrosis. No masses/lesions in the bladder. Ureteral orifices in normal anatomic location.  Indications: Patient is a 36 year old female with a history of a right ureteral calculus and a left renal stone and who has persistent left flank pain.  After discussing treatment options, she decided proceed with bilateral ureteroscopic stone manipulation.  Procedure in detail: The patient was brought to the operating room and a brief timeout was done to ensure correct patient, correct procedure, correct site.  General anesthesia was administered patient was placed in dorsal lithotomy position.  Her genitalia was then prepped and draped in usual sterile fashion.  A rigid 8 French cystoscope was passed in the urethra and the bladder.  Bladder was inspected free masses or lesions.  the ureteral orifices were in the normal orthotopic locations. a 6 french ureteral catheter was then instilled into the right ureteral orifice.  a gentle retrograde was obtained and findings noted above.  we then placed a zip wire through the ureteral catheter and advanced up to the renal pelvis.  we then removed the cystoscope and cannulated the right ureteral orifice with a semirigid ureteroscope.  No stone was found in the ureter. We then remove the ureteroscope and elected no to leave a  stent since this was an uncomplicated ureteroscopy. a 6 french ureteral catheter was then instilled into the left ureteral orifice.  a gentle retrograde was obtained and findings noted above.  we then placed a zip wire through the ureteral catheter and advanced up to the renal pelvis.  we then removed the cystoscope and cannulated the left ureteral orifice with a semirigid ureteroscope.  No stone was found in the ureter. Once we reached the UPJ a sensor wire was advanced in to the renal pelvis.  We then advanced a flexible ureteroscope over the sensor wire and up to the renal pelvis. We then performed nephroscopy and we encountered the stone in the lower pole.  The stone was then removed with an NGage basket.    We then removed the access sheath under direct vision and noted no injury to the ureter. We then placed a 6 x 26 double-j ureteral stent over the original zip wire.  We then removed the wire and good coil was noted in the the renal pelvis under fluoroscopy and the bladder under direct vision. the bladder was then drained and this concluded the procedure which was well tolerated by patient.  Complications: None  Condition: Stable, extubated, transferred to PACU  Plan: Patient is to be discharged home as to follow-up in one week. She is to remove her stent in 72 hours by pulling the tether

## 2021-04-09 NOTE — Interval H&P Note (Signed)
History and Physical Interval Note:  04/09/2021 7:42 AM  Tiffany Ward  has presented today for surgery, with the diagnosis of right ureteral calculus left renal calculus.  The various methods of treatment have been discussed with the patient and family. After consideration of risks, benefits and other options for treatment, the patient has consented to  Procedure(s): CYSTOSCOPY WITH RETROGRADE PYELOGRAM, URETEROSCOPY AND STENT PLACEMENT (Bilateral) HOLMIUM LASER APPLICATION (Bilateral) as a surgical intervention.  The patient's history has been reviewed, patient examined, no change in status, stable for surgery.  I have reviewed the patient's chart and labs.  Questions were answered to the patient's satisfaction.     Nicolette Bang

## 2021-04-09 NOTE — Anesthesia Postprocedure Evaluation (Signed)
Anesthesia Post Note  Patient: Tiffany Ward  Procedure(s) Performed: CYSTOSCOPY WITH RETROGRADE PYELOGRAM, URETEROSCOPY WITH STENT PLACEMENT (Left: Ureter) CYSTOSCOPY/RETROGRADE/URETEROSCOPY (Right: Ureter)  Patient location during evaluation: Phase II Anesthesia Type: General Level of consciousness: awake Pain management: pain level controlled Vital Signs Assessment: post-procedure vital signs reviewed and stable Respiratory status: spontaneous breathing and respiratory function stable Cardiovascular status: blood pressure returned to baseline and stable Postop Assessment: no headache and no apparent nausea or vomiting Anesthetic complications: no Comments: Late entry   No notable events documented.   Last Vitals:  Vitals:   04/09/21 1015 04/09/21 1027  BP: 130/87 137/87  Pulse: 74 62  Resp: (!) 24 18  Temp:    SpO2: 96% 98%    Last Pain:  Vitals:   04/09/21 1027  TempSrc:   PainSc: 0-No pain                 Louann Sjogren

## 2021-04-13 ENCOUNTER — Encounter (HOSPITAL_COMMUNITY): Payer: Self-pay | Admitting: Urology

## 2021-04-15 LAB — CALCULI, WITH PHOTOGRAPH (CLINICAL LAB)
Calcium Oxalate Dihydrate: 20 %
Calcium Oxalate Monohydrate: 80 %
Weight Calculi: 29 mg

## 2021-04-16 ENCOUNTER — Other Ambulatory Visit: Payer: Self-pay

## 2021-04-16 ENCOUNTER — Ambulatory Visit (INDEPENDENT_AMBULATORY_CARE_PROVIDER_SITE_OTHER): Payer: Medicaid Other | Admitting: Physician Assistant

## 2021-04-16 VITALS — BP 131/79 | HR 84

## 2021-04-16 DIAGNOSIS — N200 Calculus of kidney: Secondary | ICD-10-CM

## 2021-04-16 NOTE — Progress Notes (Signed)
Assessment: 1. Kidney stones     Plan: Patient will continue stone prevention diet and increase in fluid intake.  She will follow-up in 6 to 8 weeks for recheck visit with urinalysis and renal ultrasound prior to appointment.  Chief Complaint: Post Op   HPI: Tiffany Ward is a 36 y.o. female who presents for continued evaluation of right ureteral calculus and left renal calculus, treated on 04/09/2021 with stone manipulation, basket extraction and left-sided double-J stent placement.  She removed her stent 3 days postop without difficulty and has no complaints today.  Initial hematuria has resolved.   Portions of the above documentation were copied from a prior visit for review purposes only.  Allergies: Allergies  Allergen Reactions   Codeine Itching and Rash    PMH: Past Medical History:  Diagnosis Date   Deafness in left ear    Diabetes mellitus without complication ([place])    Diverticulitis    Sleep apnea    does not use cpap    PSH: Past Surgical History:  Procedure Laterality Date   CESAREAN SECTION     COLONOSCOPY WITH PROPOFOL N/A 12/31/2019   rourk: diverticulosis. next colonoscopy at age 36   [place], URETEROSCOPY AND STENT PLACEMENT Left 04/09/2021   Procedure: CYSTOSCOPY WITH RETROGRADE PYELOGRAM, URETEROSCOPY WITH STENT PLACEMENT;  Surgeon: Cleon Gustin, MD;  Location: [facility];  Service: Urology;  Laterality: Left;   CYSTOSCOPY/RETROGRADE/URETEROSCOPY Right 04/09/2021   Procedure: CYSTOSCOPY/RETROGRADE/URETEROSCOPY;  Surgeon: Cleon Gustin, MD;  Location: [facility];  Service: Urology;  Laterality: Right;   LAPAROSCOPIC SALPINGO OOPHERECTOMY Right 10/09/2013   Procedure: LAPAROSCOPIC RIGHT OOPHORECTOMY;  Surgeon: Jonnie Kind, MD;  Location: [facility];  Service: Gynecology;  Laterality: Right;   LT.EAR SURGERY      SH: Social History   Tobacco Use   Smoking status: Never   Smokeless tobacco: Never   Tobacco comments:     never used snuff or chewing tobacco.  Substance Use Topics   Alcohol use: No   Drug use: No    ROS: Constitutional:  Negative for fever, chills, weight loss CV: Negative for chest pain, previous MI, hypertension Respiratory:  Negative for shortness of breath, wheezing, sleep apnea, frequent cough GI:  Negative for nausea, vomiting, bloody stool, GERD  PE: BP 131/79    Pulse 84  GENERAL APPEARANCE:  Well appearing, well developed, well nourished, NAD HEENT:  Atraumatic, normocephalic NECK:  Supple. Trachea midline ABDOMEN:  Soft, non-tender, no masses EXTREMITIES:  Moves all extremities well, without clubbing, cyanosis, or edema NEUROLOGIC:  Alert and oriented x 3, normal gait, CN II-XII grossly intact MENTAL STATUS:  appropriate BACK:  Non-tender to palpation, No CVAT SKIN:  Warm, dry, and intact   Results: Laboratory Data: Lab Results  Component Value Date   WBC 13.2 (H) 03/15/2021   HGB 14.5 03/15/2021   HCT 42.9 03/15/2021   MCV 90.7 03/15/2021   PLT 215 03/15/2021    Lab Results  Component Value Date   CREATININE 0.97 03/15/2021    Urinalysis    Component Value Date/Time   COLORURINE YELLOW (A) 03/15/2021 0450   APPEARANCEUR Hazy (A) 04/03/2021 1033   LABSPEC >1.030 (H) 03/15/2021 0450   PHURINE 5.5 03/15/2021 0450   GLUCOSEU Negative 04/03/2021 1033   HGBUR MODERATE (A) 03/15/2021 0450   BILIRUBINUR Negative 04/03/2021 1033   KETONESUR NEGATIVE 03/15/2021 0450   PROTEINUR Negative 04/03/2021 1033   PROTEINUR NEGATIVE 03/15/2021 0450   UROBILINOGEN 0.2 10/03/2013 1100  NITRITE Negative 04/03/2021 1033   NITRITE NEGATIVE 03/15/2021 0450   LEUKOCYTESUR 1+ (A) 04/03/2021 1033   LEUKOCYTESUR NEGATIVE 03/15/2021 0450    Lab Results  Component Value Date   LABMICR Comment 04/03/2021   BACTERIA RARE (A) 07/19/2019    Pertinent Imaging:  Results for orders placed during the hospital encounter of 04/03/21  DG Abd 1 View  Narrative CLINICAL  DATA:  Nephrolithiasis, right abdominal pain  EXAM: ABDOMEN - 1 VIEW  COMPARISON:  None.  FINDINGS: Normal abdominal gas pattern. No gross free intraperitoneal gas. Mild hepatomegaly. No nephro or urolithiasis. Multiple phleboliths noted within the pelvis. No acute bone abnormality.  IMPRESSION: No nephro or urolithiasis.  Mild hepatomegaly.   Electronically Signed By: Fidela Salisbury M.D. On: 04/04/2021 23:44  No results found for this or any previous visit.  No results found for this or any previous visit.  No results found for this or any previous visit.  No results found for this or any previous visit.  No results found for this or any previous visit.  No results found for this or any previous visit.  Results for orders placed during the hospital encounter of 07/19/19  CT Renal Stone Study  Narrative CLINICAL DATA:  Flank pain, right  EXAM: CT ABDOMEN AND PELVIS WITHOUT CONTRAST  TECHNIQUE: Multidetector CT imaging of the abdomen and pelvis was performed following the standard protocol without IV contrast.  COMPARISON:  None.  FINDINGS: LOWER CHEST: Normal.  HEPATOBILIARY: Diffuse hypoattenuation of the liver relative to the spleen suggests hepatic steatosis. No focal liver lesion or biliary dilatation. There is cholelithiasis without acute inflammation.  PANCREAS: Normal pancreas. No ductal dilatation or peripancreatic fluid collection.  SPLEEN: Normal.  ADRENALS/URINARY TRACT: The adrenal glands are normal. There is no hydronephrosis. No right nephroureterolithiasis. On the left, there is a 3 mm stone at the renal pelvis without evidence of obstruction. There is also a 5 mm lower pole stone. The urinary bladder is normal for degree of distention  STOMACH/BOWEL: There is no hiatal hernia. Normal duodenal course and caliber. No small bowel dilatation or inflammation. There is rectosigmoid diverticulosis with acute inflammation. Six no  free intraperitoneal air or fluid collection. Normal appendix.  VASCULAR/LYMPHATIC: Normal course and caliber of the major abdominal vessels. No abdominal or pelvic lymphadenopathy.  REPRODUCTIVE: Normal uterus and ovaries.  MUSCULOSKELETAL. No bony spinal canal stenosis or focal osseous abnormality.  OTHER: None.  IMPRESSION: 1. Acute sigmoid diverticulitis without abscess or free intraperitoneal air. 2. Nonobstructive left nephrolithiasis measuring up to 5 mm. 3. No right hydronephrosis or nephroureterolithiasis. 4. Hepatic steatosis and cholelithiasis without acute inflammation.   Electronically Signed By: Ulyses Jarred M.D. On: 07/19/2019 16:22  No results found for this or any previous visit (from the past 24 hour(s)).

## 2021-05-29 ENCOUNTER — Ambulatory Visit (HOSPITAL_COMMUNITY): Admission: RE | Admit: 2021-05-29 | Payer: Medicaid Other | Source: Ambulatory Visit

## 2021-06-04 ENCOUNTER — Ambulatory Visit: Payer: Medicaid Other | Attending: Physician Assistant

## 2021-06-05 ENCOUNTER — Ambulatory Visit: Payer: Medicaid Other | Admitting: Urology

## 2021-06-26 ENCOUNTER — Ambulatory Visit: Payer: Medicaid Other | Admitting: Gastroenterology

## 2021-10-02 ENCOUNTER — Ambulatory Visit (INDEPENDENT_AMBULATORY_CARE_PROVIDER_SITE_OTHER): Payer: 59 | Admitting: Gastroenterology

## 2021-10-02 ENCOUNTER — Ambulatory Visit: Payer: 59 | Admitting: Gastroenterology

## 2021-10-02 ENCOUNTER — Encounter: Payer: Self-pay | Admitting: Gastroenterology

## 2021-10-02 VITALS — BP 123/80 | HR 69 | Temp 97.7°F | Ht 69.0 in | Wt 290.8 lb

## 2021-10-02 DIAGNOSIS — Z8719 Personal history of other diseases of the digestive system: Secondary | ICD-10-CM

## 2021-10-02 DIAGNOSIS — K760 Fatty (change of) liver, not elsewhere classified: Secondary | ICD-10-CM

## 2021-10-02 DIAGNOSIS — K529 Noninfective gastroenteritis and colitis, unspecified: Secondary | ICD-10-CM

## 2021-10-02 MED ORDER — POLYETHYLENE GLYCOL 3350 17 GM/SCOOP PO POWD: ORAL | 3 refills | Status: DC

## 2021-10-02 NOTE — Progress Notes (Signed)
GI Office Note    Referring Provider: [facility], [place] Physician:  [facility], [facility] Personal  Primary Gastroenterologist: Garfield Cornea, MD   Chief Complaint   Chief Complaint  Patient presents with   Follow-up    Had an episode of diarrhea a couple days ago and noticed bloody mucous, has since resolved. Yesterday was constipated. Had bm today but had to strain.     History of Present Illness   Tiffany Ward is a 36 y.o. female presenting today for follow-up.  Patient last seen in December 2021.  History of diverticulitis, and abnormal LFTs.  Colonoscopy completed 2021 showing diverticulosis, next colonoscopy planned for age 6.  CT renal stone protocol 2021 showing fatty liver, acute sigmoid diverticulitis.  Patient states she was doing well up until a couple of days ago.  She ate a boxed homemade pizza which she has done multiple times in the past.  She developed profuse diarrhea both days that she ate it.  Having upwards to 10 stools per day.  Denies any other particularly concerning foods or ill contacts.  No recent antibiotics.  She does not have chickens or other farm animals but she does work at a [employer], clinical with dogs.  She had some bloody mucus in the stool and some rectal irritation from going to the bathroom a lot.  Used Desitin externally with relief of perianal irritation.  After couple of days, stools are now more constipated, [place] 1.  Feels like she has to strain.  She also noted left-sided abdominal burning type pain when she was having diarrhea but this has resolved.  She states she has had several CT imaging for renal stones and every time the report mentioned diverticulitis.  In May 2021 at [facility] she had CT renal stone protocol with these findings and was given Cipro and Flagyl.  At that time she presented with right flank pain. She believes her last CT was in [place].  At baseline, typically has regular bowel  movements.  She has intermittent heartburn with food triggers which she tries to avoid. Weight down 12 pounds since 2021.  She is on metformin now for diabetes.    Medications   Current Outpatient Medications  Medication Sig Dispense Refill   atorvastatin (LIPITOR) 20 MG tablet Take 20 mg by mouth at bedtime.     etonogestrel (NEXPLANON) 68 MG IMPL implant 68 mg by Subdermal route once.     metFORMIN (GLUCOPHAGE-XR) 500 MG 24 hr tablet Take 500 mg by mouth 2 (two) times daily.     montelukast (SINGULAIR) 10 MG tablet Take 10 mg by mouth at bedtime.      polyethylene glycol powder (GLYCOLAX/MIRALAX) 17 GM/SCOOP powder Take one capful twice daily until soft stool then use once daily as needed. 255 g 3   No current facility-administered medications for this visit.    Allergies   Allergies as of 10/02/2021 - Review Complete 10/02/2021  Allergen Reaction Noted   Codeine Itching and Rash 08/15/2013          Review of Systems   General: Negative for anorexia, weight loss, fever, chills, fatigue, weakness. ENT: Negative for hoarseness, difficulty swallowing , nasal congestion. CV: Negative for chest pain, angina, palpitations, dyspnea on exertion, peripheral edema.  Respiratory: Negative for dyspnea at rest, dyspnea on exertion, cough, sputum, wheezing.  GI: See history of present illness. GU:  Negative for dysuria, hematuria, urinary incontinence, urinary frequency, nocturnal urination.  Endo:  Negative for unusual weight change.     Physical Exam   BP 123/80 (BP Location: Left Arm, Patient Position: Sitting, Cuff Size: Large)   Pulse 69   Temp 97.7 F (36.5 C) (Temporal)   Ht '5\' 9"'$  (1.753 m)   Wt 290 lb 12.8 oz (131.9 kg)   LMP  (LMP Unknown)   SpO2 97%   BMI 42.94 kg/m    General: Well-nourished, well-developed in no acute distress.  Eyes: No icterus. Mouth: Oropharyngeal mucosa moist and pink , no lesions erythema or exudate. Lungs: Clear to auscultation bilaterally.   Heart: Regular rate and rhythm, no murmurs rubs or gallops.  Abdomen: Bowel sounds are normal, nontender, nondistended, no hepatosplenomegaly or masses,  no abdominal bruits or hernia , no rebound or guarding.  Rectal: Patient declined Extremities: No lower extremity edema. No clubbing or deformities. Neuro: Alert and oriented x 4   Skin: Warm and dry, no jaundice.   Psych: Alert and cooperative, normal mood and affect.  Labs   No recent labs  Imaging Studies   CT abdomen and pelvis renal stone protocol May 2021 for right flank pain  IMPRESSION: 1. Acute sigmoid diverticulitis without abscess or free intraperitoneal air. 2. Nonobstructive left nephrolithiasis measuring up to 5 mm. 3. No right hydronephrosis or nephroureterolithiasis. 4. Hepatic steatosis and cholelithiasis without acute inflammation.     Electronically Signed   By: Ulyses Jarred M.D.   On: 07/19/2019 16:22  Assessment   Gastroenteritis: suspect acute gastroenteritis for acute onset diarrhea which has now resolved. In fact now complains of constipation. Possible food borne. Bloody mucous in stool could be secondary to hemorrhoids or colitis. Seems to be getting better. Will continue to monitor but if recurrent or persistent symptoms would consider stool studies or empiric antibiotics. Request recent imaging. Doubt we are dealing with diverticulitis. Colonoscopy from 2021 reassuring.   Fatty liver: due for labs at this time. She has lost 12 pounds in the past year. States decreased oral intake likely contributing.   H/o diverticulitis: last treated with antibiotics in 2021 due to CT findings suggestive of diverticulitis. Current episode atypical given acute onset profuse diarrhea. Obtain copy of last CT report from [facility] for review.     PLAN   Start Miralax one capful twice daily until soft stool, then continue once daily as needed.  Hemorrhoid cream OTC as per package label as needed.  Obtain copy of  latest CT done in [place]. She will monitor symptoms over the weekend, call if any recurrent diarrhea, abdominal pain or bleeding.  CBC, CMET.   Laureen Ochs. Bobby Rumpf, [place], [facility]

## 2021-10-02 NOTE — Patient Instructions (Signed)
Start MiraLAX 1 capful twice daily until soft stool, then continue once daily as needed. If needed, you can use over-the-counter hemorrhoid cream internally twice daily for rectal irritation. Please have labs to follow-up on your liver. We will obtain a copy last CT imaging in [place]. Monitor your symptoms over the weekend, if recurrent diarrhea, abdominal pain, bloody stool, please call us on Monday morning.  If you need assistance over the weekend, you can call the on-call provider by contacting the hospital operator at [phone].

## 2021-10-03 LAB — CBC WITH DIFFERENTIAL/PLATELET
Basophils Absolute: 0.1 10*3/uL (ref 0.0–0.2)
Basos: 1 %
EOS (ABSOLUTE): 0.1 10*3/uL (ref 0.0–0.4)
Eos: 1 %
Hematocrit: 43.7 % (ref 34.0–46.6)
Hemoglobin: 14.5 g/dL (ref 11.1–15.9)
Immature Grans (Abs): 0 10*3/uL (ref 0.0–0.1)
Immature Granulocytes: 0 %
Lymphocytes Absolute: 2.7 10*3/uL (ref 0.7–3.1)
Lymphs: 30 %
MCH: 30.1 pg (ref 26.6–33.0)
MCHC: 33.2 g/dL (ref 31.5–35.7)
MCV: 91 fL (ref 79–97)
Monocytes Absolute: 0.5 10*3/uL (ref 0.1–0.9)
Monocytes: 6 %
Neutrophils Absolute: 5.5 10*3/uL (ref 1.4–7.0)
Neutrophils: 62 %
Platelets: 257 10*3/uL (ref 150–450)
RBC: 4.82 x10E6/uL (ref 3.77–5.28)
RDW: 12.1 % (ref 11.7–15.4)
WBC: 8.9 10*3/uL (ref 3.4–10.8)

## 2021-10-03 LAB — COMPREHENSIVE METABOLIC PANEL
ALT: 24 IU/L (ref 0–32)
AST: 19 IU/L (ref 0–40)
Albumin/Globulin Ratio: 1.6 (ref 1.2–2.2)
Albumin: 4.2 g/dL (ref 3.9–4.9)
Alkaline Phosphatase: 60 IU/L (ref 44–121)
BUN/Creatinine Ratio: 17 (ref 9–23)
BUN: 11 mg/dL (ref 6–20)
Bilirubin Total: 0.4 mg/dL (ref 0.0–1.2)
CO2: 22 mmol/L (ref 20–29)
Calcium: 9.3 mg/dL (ref 8.7–10.2)
Chloride: 101 mmol/L (ref 96–106)
Creatinine, Ser: 0.66 mg/dL (ref 0.57–1.00)
Globulin, Total: 2.6 g/dL (ref 1.5–4.5)
Glucose: 156 mg/dL — ABNORMAL HIGH (ref 70–99)
Potassium: 4.2 mmol/L (ref 3.5–5.2)
Sodium: 140 mmol/L (ref 134–144)
Total Protein: 6.8 g/dL (ref 6.0–8.5)
eGFR: 118 mL/min/{1.73_m2} (ref >59)

## 2021-10-05 ENCOUNTER — Telehealth: Payer: Self-pay

## 2021-10-05 NOTE — Telephone Encounter (Signed)
Spoke with patient.  She reports having some mild discomfort on the left side of her abdomen about 4 out of 10 in severity.  Overall, this is improved compared to her acute symptoms last week.  She does continue with some increased gassiness and also reports she is having incomplete bowel movements with straining.  She passes stool about 5 or 6 times a day, but only very small amounts.  Denies bleeding.  She denies any nausea, vomiting, fever. She does have rectal irritation described as a rawness related to wiping so much. States she has desitin to use and can't afford to buy anything else at this time. She does have history of hemorrhoids, but doesn't feel her hemorrhoids are flared at this time.   At this point, I recommended we continue supportive measures as her symptoms are improving.  Sounds like she is more on the constipated side.  Recommended starting MiraLAX daily, use Desitin as skin protectant/barrier, and call with progress report in 2-3 days or sooner if worsening symptoms.

## 2021-10-05 NOTE — Telephone Encounter (Signed)
Pt was seen in the office by Neil Crouch on 10/02/2021 and was instructed to call with a report this morning. Pt states that she had hard stools on Fri and Sat, but started to have diarrhea again on Sunday. Pt states that she is still having loose stools today and has called out of work. Pt is also having some abd pain that she thinks may be coming from where she had to strain to have a bm on Friday and Saturday. Pt states that she has not taken any Miralax yet that [place] instructed her to start taking at her ov. Pt is requesting a work note for today as well as recommendations as to what she should do. Routing to [employer] in Neil Crouch' absence.

## 2021-10-05 NOTE — Telephone Encounter (Signed)
Requested CT abd/pelvis in box for review

## 2021-11-04 NOTE — Telephone Encounter (Signed)
CT abdomen pelvis without contrast December 17, 2020: Small calcified gallstones noted, question uncomplicated sigmoid diverticulitis.  Left nephrolithiasis.  Colonoscopy up to date.

## 2022-05-12 ENCOUNTER — Emergency Department (HOSPITAL_COMMUNITY)
Admission: EM | Admit: 2022-05-12 | Discharge: 2022-05-12 | Disposition: A | Discharge status: Home / Self Care | Payer: Self-pay | Attending: Emergency Medicine | Admitting: Emergency Medicine

## 2022-05-12 ENCOUNTER — Encounter (HOSPITAL_COMMUNITY): Payer: Self-pay

## 2022-05-12 ENCOUNTER — Emergency Department (HOSPITAL_COMMUNITY): Payer: Self-pay

## 2022-05-12 ENCOUNTER — Other Ambulatory Visit: Payer: Self-pay

## 2022-05-12 DIAGNOSIS — K8070 Calculus of gallbladder and bile duct without cholecystitis without obstruction: Secondary | ICD-10-CM | POA: Insufficient documentation

## 2022-05-12 DIAGNOSIS — K8050 Calculus of bile duct without cholangitis or cholecystitis without obstruction: Secondary | ICD-10-CM

## 2022-05-12 DIAGNOSIS — K5792 Diverticulitis of intestine, part unspecified, without perforation or abscess without bleeding: Secondary | ICD-10-CM

## 2022-05-12 DIAGNOSIS — K5732 Diverticulitis of large intestine without perforation or abscess without bleeding: Secondary | ICD-10-CM | POA: Insufficient documentation

## 2022-05-12 DIAGNOSIS — K8020 Calculus of gallbladder without cholecystitis without obstruction: Secondary | ICD-10-CM

## 2022-05-12 HISTORY — DX: Calculus of gallbladder without cholecystitis without obstruction: K80.20

## 2022-05-12 LAB — CBC WITH DIFFERENTIAL/PLATELET
Abs Immature Granulocytes: 0.03 10*3/uL (ref 0.00–0.07)
Basophils Absolute: 0.1 10*3/uL (ref 0.0–0.1)
Basophils Relative: 0 %
Eosinophils Absolute: 0.2 10*3/uL (ref 0.0–0.5)
Eosinophils Relative: 2 %
HCT: 43.2 % (ref 36.0–46.0)
Hemoglobin: 14.7 g/dL (ref 12.0–15.0)
Immature Granulocytes: 0 %
Lymphocytes Relative: 31 %
Lymphs Abs: 3.6 10*3/uL (ref 0.7–4.0)
MCH: 30.1 pg (ref 26.0–34.0)
MCHC: 34 g/dL (ref 30.0–36.0)
MCV: 88.3 fL (ref 80.0–100.0)
Monocytes Absolute: 0.7 10*3/uL (ref 0.1–1.0)
Monocytes Relative: 6 %
Neutro Abs: 7 10*3/uL (ref 1.7–7.7)
Neutrophils Relative %: 61 %
Platelets: 267 10*3/uL (ref 150–400)
RBC: 4.89 MIL/uL (ref 3.87–5.11)
RDW: 12.8 % (ref 11.5–15.5)
WBC: 11.5 10*3/uL — ABNORMAL HIGH (ref 4.0–10.5)
nRBC: 0 % (ref 0.0–0.2)

## 2022-05-12 LAB — COMPREHENSIVE METABOLIC PANEL
ALT: 35 U/L (ref 0–44)
AST: 28 U/L (ref 15–41)
Albumin: 4.1 g/dL (ref 3.5–5.0)
Alkaline Phosphatase: 63 U/L (ref 38–126)
Anion gap: 10 (ref 5–15)
BUN: 14 mg/dL (ref 6–20)
CO2: 24 mmol/L (ref 22–32)
Calcium: 9.3 mg/dL (ref 8.9–10.3)
Chloride: 103 mmol/L (ref 98–111)
Creatinine, Ser: 0.67 mg/dL (ref 0.44–1.00)
GFR, Estimated: >60 mL/min (ref >60)
Glucose, Bld: 178 mg/dL — ABNORMAL HIGH (ref 70–99)
Potassium: 3.6 mmol/L (ref 3.5–5.1)
Sodium: 137 mmol/L (ref 135–145)
Total Bilirubin: 0.4 mg/dL (ref 0.3–1.2)
Total Protein: 7.4 g/dL (ref 6.5–8.1)

## 2022-05-12 LAB — POC URINE PREG, ED: Preg Test, Ur: NEGATIVE

## 2022-05-12 LAB — URINALYSIS, ROUTINE W REFLEX MICROSCOPIC
Bilirubin Urine: NEGATIVE
Glucose, UA: 50 mg/dL — AB
Ketones, ur: NEGATIVE mg/dL
Leukocytes,Ua: NEGATIVE
Nitrite: NEGATIVE
Protein, ur: NEGATIVE mg/dL
Specific Gravity, Urine: 1.024 (ref 1.005–1.030)
pH: 5 (ref 5.0–8.0)

## 2022-05-12 LAB — LIPASE, BLOOD: Lipase: 29 U/L (ref 11–51)

## 2022-05-12 MED ORDER — ONDANSETRON 4 MG PO TBDP: 4.0000 mg | ORAL_TABLET | Freq: Three times a day (TID) | ORAL | 0 refills | Status: AC | PRN

## 2022-05-12 MED ORDER — ONDANSETRON HCL 4 MG/2ML IJ SOLN
4.0000 mg | Freq: Once | INTRAMUSCULAR | Status: AC
Start: 2022-05-12 — End: 2022-05-12
  Administered 2022-05-12: 4 mg via INTRAVENOUS
  Filled 2022-05-12: qty 2

## 2022-05-12 MED ORDER — AMOXICILLIN-POT CLAVULANATE 875-125 MG PO TABS
1.0000 | ORAL_TABLET | Freq: Once | ORAL | Status: AC
  Administered 2022-05-12: 1 via ORAL
  Filled 2022-05-12: qty 1

## 2022-05-12 MED ORDER — IOHEXOL 300 MG/ML  SOLN
100.0000 mL | Freq: Once | INTRAMUSCULAR | Status: AC | PRN
  Administered 2022-05-12: 100 mL via INTRAVENOUS

## 2022-05-12 MED ORDER — MORPHINE SULFATE (PF) 4 MG/ML IV SOLN
6.0000 mg | Freq: Once | INTRAVENOUS | Status: AC
  Administered 2022-05-12: 6 mg via INTRAVENOUS
  Filled 2022-05-12: qty 2

## 2022-05-12 MED ORDER — LACTATED RINGERS IV BOLUS
1000.0000 mL | Freq: Once | INTRAVENOUS | Status: AC
Start: 2022-05-12 — End: 2022-05-12
  Administered 2022-05-12: 1000 mL via INTRAVENOUS

## 2022-05-12 MED ORDER — AMOXICILLIN-POT CLAVULANATE 875-125 MG PO TABS: 1.0000 | ORAL_TABLET | Freq: Two times a day (BID) | ORAL | 0 refills | Status: DC

## 2022-05-12 MED ORDER — OXYCODONE HCL 5 MG PO TABS
5.0000 mg | ORAL_TABLET | ORAL | 0 refills | Status: DC | PRN
Start: 2022-05-12 — End: 2022-06-11

## 2022-05-12 NOTE — ED Provider Notes (Signed)
[facility] Provider Note   CSN: [id number] Arrival date & time: 05/12/22  1857     History  Chief Complaint  Patient presents with   Abdominal Pain    Dx with gallstones on last scan per pt    Tiffany Ward is a 37 y.o. female.  37 year old female who presents emergency department with right upper quadrant abdominal pain.  Says that she has had longstanding right upper quadrant abdominal pain that has been intermittent and sharp.  Says that today it became more severe and that she had approximately 4 episodes of nonbloody nonbilious emesis.  Has also had multiple episodes of diarrhea that been nonmelanotic and nonbloody.  Denies any fevers.  Did have right oophorectomy but denies any history of appendectomy or cholecystectomy.  Had been told previously that she has gallstones.  Denies any dysuria or frequency or hematuria.       Home Medications Prior to Admission medications   Medication Sig Start Date End Date Taking? Authorizing Provider  amoxicillin-clavulanate (AUGMENTIN) 875-125 MG tablet Take 1 tablet by mouth every 12 (twelve) hours. 05/12/22  Yes Fransico Meadow, MD  ondansetron (ZOFRAN-ODT) 4 MG disintegrating tablet Take 1 tablet (4 mg total) by mouth every 8 (eight) hours as needed for nausea or vomiting. 05/12/22  Yes Fransico Meadow, MD  oxyCODONE (ROXICODONE) 5 MG immediate release tablet Take 1 tablet (5 mg total) by mouth every 4 (four) hours as needed for severe pain. 05/12/22  Yes Fransico Meadow, MD  atorvastatin (LIPITOR) 20 MG tablet Take 20 mg by mouth at bedtime. 07/03/19   [provider]  etonogestrel (NEXPLANON) 68 MG IMPL implant 68 mg by Subdermal route once.    [provider]  metFORMIN (GLUCOPHAGE-XR) 500 MG 24 hr tablet Take 500 mg by mouth 2 (two) times daily. 07/04/19   [provider]  montelukast (SINGULAIR) 10 MG tablet Take 10 mg by mouth at bedtime.  07/03/19   [provider]  polyethylene glycol powder (GLYCOLAX/MIRALAX) 17 GM/SCOOP powder Take one capful twice daily until soft stool then use once daily as needed. 10/02/21   Mahala Menghini, PA-C      Allergies    Codeine    Review of Systems   Review of Systems  Physical Exam Updated Vital Signs BP (!) 142/86   Pulse 78   Temp 98.1 F (36.7 C)   Resp 18   Ht '5\' 8"'$  (1.727 m)   Wt 130.2 kg   SpO2 97%   BMI 43.64 kg/m  Physical Exam Vitals and nursing note reviewed.  Constitutional:      General: She is not in acute distress.    Appearance: She is well-developed.  HENT:     Head: Normocephalic and atraumatic.     Right Ear: External ear normal.     Left Ear: External ear normal.     Nose: Nose normal.  Eyes:     Extraocular Movements: Extraocular movements intact.     Conjunctiva/sclera: Conjunctivae normal.     Pupils: Pupils are equal, round, and reactive to light.  Cardiovascular:     Rate and Rhythm: Normal rate and regular rhythm.  Pulmonary:     Effort: Pulmonary effort is normal. No respiratory distress.  Abdominal:     General: Abdomen is flat. There is no distension.     Palpations: Abdomen is soft. There is no mass.     Tenderness: There is abdominal tenderness (Epigastrium  and right upper quadrant). There is no guarding.  Musculoskeletal:     Cervical back: Normal range of motion and neck supple.  Skin:    General: Skin is warm and dry.  Neurological:     Mental Status: She is alert and oriented to person, place, and time. Mental status is at baseline.  Psychiatric:        Mood and Affect: Mood normal.     ED Results / Procedures / Treatments   Labs (all labs ordered are listed, but only abnormal results are displayed) Labs Reviewed  COMPREHENSIVE METABOLIC PANEL - Abnormal; Notable for the following components:      Result Value   Glucose, Bld 178 (*)    All other components within normal limits  CBC WITH DIFFERENTIAL/PLATELET - Abnormal; Notable for  the following components:   WBC 11.5 (*)    All other components within normal limits  URINALYSIS, ROUTINE W REFLEX MICROSCOPIC - Abnormal; Notable for the following components:   APPearance HAZY (*)    Glucose, UA 50 (*)    Hgb urine dipstick MODERATE (*)    Bacteria, UA RARE (*)    All other components within normal limits  LIPASE, BLOOD  POC URINE PREG, ED    EKG None  Radiology CT ABDOMEN PELVIS W CONTRAST  Result Date: 05/12/2022 CLINICAL DATA:  Right upper quadrant pain intermittently for 1 year, history of nephrolithiasis and cholelithiasis EXAM: CT ABDOMEN AND PELVIS WITH CONTRAST TECHNIQUE: Multidetector CT imaging of the abdomen and pelvis was performed using the standard protocol following bolus administration of intravenous contrast. RADIATION DOSE REDUCTION: This exam was performed according to the departmental dose-optimization program which includes automated exposure control, adjustment of the mA and/or kV according to patient size and/or use of iterative reconstruction technique. CONTRAST:  192m OMNIPAQUE IOHEXOL 300 MG/ML  SOLN COMPARISON:  07/19/2019 FINDINGS: Lower chest: No acute pleural or parenchymal lung disease. Hepatobiliary: Calcified gallstones without evidence of acute cholecystitis. The liver is unremarkable. No biliary duct dilation or choledocholithiasis. Pancreas: Unremarkable. No pancreatic ductal dilatation or surrounding inflammatory changes. Spleen: Normal in size without focal abnormality. Adrenals/Urinary Tract: Adrenal glands are unremarkable. Kidneys are normal, without renal calculi, focal lesion, or hydronephrosis. Bladder is unremarkable. Stomach/Bowel: No bowel obstruction or ileus. There is diverticulosis throughout the colon, with segmental wall thickening of the proximal sigmoid colon and surrounding fat stranding consistent with acute diverticulitis. No perforation, fluid collection, or abscess. Normal appendix right lower quadrant.  Vascular/Lymphatic: No significant vascular findings are present. No enlarged abdominal or pelvic lymph nodes. Reproductive: Uterus and bilateral adnexa are stable. Other: No free fluid or free intraperitoneal gas. No abdominal wall hernia. Musculoskeletal: No acute or destructive bony lesions. Reconstructed images demonstrate no additional findings. IMPRESSION: 1. Acute uncomplicated sigmoid diverticulitis. No perforation, fluid collection, or abscess. 2. Cholelithiasis without cholecystitis. Electronically Signed   By: MRanda NgoM.D.   On: 05/12/2022 21:37    Procedures Procedures    Medications Ordered in ED Medications  morphine (PF) 4 MG/ML injection 6 mg (6 mg Intravenous Given 05/12/22 2040)  ondansetron (ZOFRAN) injection 4 mg (4 mg Intravenous Given 05/12/22 2039)  lactated ringers bolus 1,000 mL (0 mLs Intravenous Stopped 05/12/22 2220)  iohexol (OMNIPAQUE) 300 MG/ML solution 100 mL (100 mLs Intravenous Contrast Given 05/12/22 2118)  amoxicillin-clavulanate (AUGMENTIN) 875-125 MG per tablet 1 tablet (1 tablet Oral Given 05/12/22 2255)    ED Course/ Medical Decision Making/ A&P  Medical Decision Making Amount and/or Complexity of Data Reviewed Labs: ordered. Radiology: ordered.  Risk Prescription drug management.   Tameica Barreras is a 37 y.o. female with comorbidities that complicate the patient evaluation including gallstones who presents emergency department with right upper quadrant pain nausea and vomiting  Initial Ddx:  Cholecystitis, biliary colic, gastroenteritis, appendicitis, pancreatitis  MDM:  Feel the patient likely has cholecystitis or pancreatitis based on her symptoms.  Will obtain lab work and CT imaging at this time since we do not have ultrasound available.  Potentially also have gastroenteritis or appendicitis but feel this is less likely.  Plan:  Labs Lipase CT abdomen pelvis with IV contrast IV  fluids Morphine Zofran  ED Summary/Re-evaluation:  Patient underwent the above evaluation was found to have diverticulitis.  Was found to have gallstones without evidence of cholecystitis.  She was given Augmentin for her diverticulitis to go home with.  Also was given oxycodone for any breakthrough pain that she should have as well as Zofran for any nausea and vomiting.  Was counseled on the symptoms that would suggest cholecystitis and to return to the emergency department for repeat imaging if she should develop any of these.  Also recommended that she see general surgery as soon as possible regarding her gallstones to see if she can have a cholecystectomy scheduled since she has been having the symptoms for approximately a year.  This patient presents to the ED for concern of complaints listed in HPI, this involves an extensive number of treatment options, and is a complaint that carries with it a high risk of complications and morbidity. Disposition including potential need for admission considered.   Dispo: DC Home. Return precautions discussed including, but not limited to, those listed in the AVS. Allowed pt time to ask questions which were answered fully prior to dc.  Additional history obtained from family Records reviewed ED Visit Notes The following labs were independently interpreted: Chemistry and show no acute abnormality I independently reviewed the following imaging with scope of interpretation limited to determining acute life threatening conditions related to emergency care: CT Abdomen/Pelvis and agree with the radiologist interpretation with the following exceptions: none I personally reviewed and interpreted cardiac monitoring: normal sinus rhythm  I personally reviewed and interpreted the pt's EKG: see above for interpretation  I have reviewed the patients home medications and made adjustments as needed  Final Clinical Impression(s) / ED Diagnoses Final diagnoses:   Diverticulitis  Biliary colic  Calculus of gallbladder without cholecystitis without obstruction    Rx / DC Orders ED Discharge Orders          Ordered    amoxicillin-clavulanate (AUGMENTIN) 875-125 MG tablet  Every 12 hours        05/12/22 2240    oxyCODONE (ROXICODONE) 5 MG immediate release tablet  Every 4 hours PRN        05/12/22 2240    ondansetron (ZOFRAN-ODT) 4 MG disintegrating tablet  Every 8 hours PRN        05/12/22 2240              Fransico Meadow, MD 05/12/22 2356

## 2022-05-12 NOTE — Discharge Instructions (Signed)
You were seen for your abdominal pain in the emergency department.  It is likely from a condition called biliary colic.  You were also found to have diverticulitis.  At home, please take the antibiotics we have prescribed you for your diverticulitis.  You may use Tylenol and ibuprofen for your pain.  You may use oxycodone for any breakthrough pain that you have but do not take this before driving or operating heavy machinery.  Use the Zofran we have prescribed you for any nausea or vomiting.    Check your MyChart online for the results of any tests that had not resulted by the time you left the emergency department.   Follow-up with the surgical team as soon as possible to see if your gallbladder needs to be removed.  Return immediately to the emergency department if you experience any of the following: Worsening pain, fevers, vomiting despite the medication, or any other concerning symptoms.    Thank you for visiting our Emergency Department. It was a pleasure taking care of you today.

## 2022-05-12 NOTE — ED Triage Notes (Signed)
Pt here for RUQ pain that has been intermittent for a year, pt had scan done last year by kidney Dr when she had kidney stones and scan showed gallstones as well. Pt went to urgent care last week and was prescribed bentyl and instructed to come to ED if pain persisted. Pt doesn't have PCP or health insurance.

## 2022-05-27 ENCOUNTER — Encounter: Payer: Self-pay | Admitting: Surgery

## 2022-05-27 ENCOUNTER — Other Ambulatory Visit: Payer: Self-pay

## 2022-05-27 ENCOUNTER — Ambulatory Visit (INDEPENDENT_AMBULATORY_CARE_PROVIDER_SITE_OTHER): Payer: Self-pay | Admitting: Surgery

## 2022-05-27 VITALS — BP 121/81 | HR 64 | Temp 98.4°F | Resp 19 | Ht 69.0 in | Wt 288.0 lb

## 2022-05-27 DIAGNOSIS — K8020 Calculus of gallbladder without cholecystitis without obstruction: Secondary | ICD-10-CM

## 2022-05-27 DIAGNOSIS — K5732 Diverticulitis of large intestine without perforation or abscess without bleeding: Secondary | ICD-10-CM

## 2022-05-27 DIAGNOSIS — J329 Chronic sinusitis, unspecified: Secondary | ICD-10-CM | POA: Insufficient documentation

## 2022-05-27 DIAGNOSIS — H9190 Unspecified hearing loss, unspecified ear: Secondary | ICD-10-CM | POA: Insufficient documentation

## 2022-05-27 NOTE — Progress Notes (Signed)
[facility] History and Physical  Reason for Referral: Cholelithiasis, diverticulitis Referring Physician: ED referral  Chief Complaint   New Patient (Initial Visit)     Tiffany Ward is a 37 y.o. female.  HPI: Patient presents for evaluation of cholelithiasis.  For the last 3 months, she has had intermittent episodes of right upper quadrant abdominal pain, shoulder pain, back pain, nausea, and vomiting.  She presented to an urgent care a few weeks ago and subsequently the emergency department.  She was having the same symptoms in addition to diarrhea.  At that time, she was noted to have cholelithiasis and diverticulitis on CT scan.  She was discharged home with antibiotics.  Greasy foods seem to make the pain worse.  She does have a history of diverticulitis which is normally exacerbated by her diet.  She usually will have diarrhea associated with her diverticulitis.  Her past medical history significant for hyperlipidemia, diabetes, and seasonal allergies.  Her surgical history significant for C-section, left ear surgeries, right oophorectomy, and lithotripsy for kidney stones.  She denies use of blood thinning medications.  She denies use of tobacco products, alcohol, and illicit drugs.  Past Medical History:  Diagnosis Date   Deafness in left ear    Diabetes mellitus without complication ([place])    Diverticulitis    Gallstones    Hyperlipidemia    Sleep apnea    does not use cpap    Past Surgical History:  Procedure Laterality Date   CESAREAN SECTION     COLONOSCOPY WITH PROPOFOL N/A 12/31/2019   rourk: diverticulosis. next colonoscopy at age 56   [place], URETEROSCOPY AND STENT PLACEMENT Left 04/09/2021   Procedure: CYSTOSCOPY WITH RETROGRADE PYELOGRAM, URETEROSCOPY WITH STENT PLACEMENT;  Surgeon: Cleon Gustin, MD;  Location: [facility];  Service: Urology;  Laterality: Left;   CYSTOSCOPY/RETROGRADE/URETEROSCOPY Right 04/09/2021    Procedure: CYSTOSCOPY/RETROGRADE/URETEROSCOPY;  Surgeon: Cleon Gustin, MD;  Location: [facility];  Service: Urology;  Laterality: Right;   LAPAROSCOPIC SALPINGO OOPHERECTOMY Right 10/09/2013   Procedure: LAPAROSCOPIC RIGHT OOPHORECTOMY;  Surgeon: Jonnie Kind, MD;  Location: [facility];  Service: Gynecology;  Laterality: Right;   LT.EAR SURGERY      Family History  Problem Relation Age of Onset   Diabetes Mother    Hypertension Mother    Hypertension Father    Hypertension Sister    Hypertension Paternal Grandmother    Hypertension Sister    Diabetes Sister    Diverticulitis Other        Multiple family members   Colon cancer Neg Hx     Social History   Tobacco Use   Smoking status: Never   Smokeless tobacco: Never   Tobacco comments:    never used snuff or chewing tobacco.  Vaping Use   Vaping Use: Never used  Substance Use Topics   Alcohol use: No   Drug use: No    Medications: I have reviewed the patient's current medications. Allergies as of 05/27/2022       Reactions   Codeine Itching, Rash, Other (See Comments)        Medication List        Accurate as of May 27, 2022  9:32 AM. If you have any questions, ask your nurse or doctor.          STOP taking these medications    amoxicillin-clavulanate 875-125 MG tablet Commonly known as: AUGMENTIN Stopped by: Latravia Southgate A Albi Rappaport, DO   polyethylene glycol powder 17  GM/SCOOP powder Commonly known as: GLYCOLAX/MIRALAX Stopped by: Marchell Froman A Lynanne Delgreco, DO       TAKE these medications    atorvastatin 40 MG tablet Commonly known as: LIPITOR Take 40 mg by mouth daily. What changed: Another medication with the same name was removed. Continue taking this medication, and follow the directions you see here. Changed by: Roopa Graver A Jiyah Torpey, DO   etonogestrel 68 MG Impl implant Commonly known as: NEXPLANON 68 mg by Subdermal route once.   metFORMIN 500 MG 24 hr tablet Commonly known as:  GLUCOPHAGE-XR Take 500 mg by mouth 2 (two) times daily.   montelukast 10 MG tablet Commonly known as: SINGULAIR Take 10 mg by mouth at bedtime.   ondansetron 4 MG disintegrating tablet Commonly known as: ZOFRAN-ODT Take 1 tablet (4 mg total) by mouth every 8 (eight) hours as needed for nausea or vomiting.   oxyCODONE 5 MG immediate release tablet Commonly known as: Roxicodone Take 1 tablet (5 mg total) by mouth every 4 (four) hours as needed for severe pain.   Vitamin D 50 MCG (2000 UT) Caps Take 2,000 Units by mouth daily.   ZyrTEC Allergy 10 MG tablet Generic drug: cetirizine Take 10 mg by mouth daily.         ROS:  Constitutional: negative for chills, fatigue, and fevers Eyes: negative for visual disturbance and pain Ears, nose, mouth, throat, and face: negative for ear drainage, sore throat, and sinus problems Respiratory: negative for cough, wheezing, and shortness of breath Cardiovascular: negative for chest pain and palpitations Gastrointestinal: positive for nausea and reflux symptoms, negative for abdominal pain and vomiting Genitourinary:negative for dysuria and frequency Integument/breast: negative for dryness and rash Hematologic/lymphatic: negative for bleeding and lymphadenopathy Musculoskeletal:negative for back pain and neck pain Neurological: positive for dizziness Endocrine: negative for temperature intolerance  Blood pressure 121/81, pulse 64, temperature 98.4 F (36.9 C), temperature source Oral, resp. rate 19, height 5\' 9"  (1.753 m), weight 288 lb (130.6 kg), SpO2 95 %. Physical Exam Vitals reviewed.  Constitutional:      Appearance: Normal appearance.  HENT:     Head: Normocephalic and atraumatic.  Eyes:     Extraocular Movements: Extraocular movements intact.     Pupils: Pupils are equal, round, and reactive to light.  Cardiovascular:     Rate and Rhythm: Normal rate and regular rhythm.  Pulmonary:     Effort: Pulmonary effort is normal.      Breath sounds: Normal breath sounds.  Abdominal:     Comments: Abdomen soft, nondistended, no percussion tenderness, nontender to palpation; no rigidity, guarding, rebound tenderness; negative Murphy sign  Musculoskeletal:        General: Normal range of motion.     Cervical back: Normal range of motion.  Skin:    General: Skin is warm and dry.  Neurological:     General: No focal deficit present.     Mental Status: She is alert and oriented to person, place, and time.  Psychiatric:        Mood and Affect: Mood normal.        Behavior: Behavior normal.     Results: CT abdomen pelvis (05/12/2022):  IMPRESSION: 1. Acute uncomplicated sigmoid diverticulitis. No perforation, fluid collection, or abscess. 2. Cholelithiasis without cholecystitis.   Assessment & Plan:  Ambor Hrncir is a 37 y.o. female who presents for evaluation of cholelithiasis and diverticulitis.  -Explained the pathophysiology of gallbladder disease, and why we recommend surgical excision.  We also discussed diverticulitis.  While the patient's symptoms are resolving, given her recent diagnosis of diverticulitis, we will schedule cholecystectomy in 2 weeks to allow any residual inflammation to subside -I counseled the patient about the indication, risks and benefits of robotic assisted laparoscopic cholecystectomy.  She understands there is a very small chance for bleeding, infection, injury to normal structures (including common bile duct), conversion to open surgery, persistent symptoms, evolution of postcholecystectomy diarrhea, need for secondary interventions, anesthesia reaction, cardiopulmonary issues and other risks not specifically detailed here. I described the expected recovery, the plan for follow-up and the restrictions during the recovery phase.  All questions were answered. -Patient tentatively scheduled for surgery on 4/12.  She was also given information to obtain financial assistance, as she  currently has no insurance -Information provided to the patient regarding cholecystectomy, cholelithiasis, and low-fat diet -Advised patient to present to the emergency department if she begins to have fever, chills, worsening abdominal pain, nausea, and vomiting -Also advised patient to call our office if we need to reschedule  All questions were answered to the satisfaction of the patient.  Graciella Freer, DO [facility] [address] [place], [place] [address] [phone] (office)

## 2022-05-28 NOTE — H&P (Signed)
[facility] History and Physical  Reason for Referral: Cholelithiasis, diverticulitis Referring Physician: ED referral  Chief Complaint   New Patient (Initial Visit)     Tiffany Ward is a 37 y.o. female.  HPI: Patient presents for evaluation of cholelithiasis.  For the last 3 months, she has had intermittent episodes of right upper quadrant abdominal pain, shoulder pain, back pain, nausea, and vomiting.  She presented to an urgent care a few weeks ago and subsequently the emergency department.  She was having the same symptoms in addition to diarrhea.  At that time, she was noted to have cholelithiasis and diverticulitis on CT scan.  She was discharged home with antibiotics.  Greasy foods seem to make the pain worse.  She does have a history of diverticulitis which is normally exacerbated by her diet.  She usually will have diarrhea associated with her diverticulitis.  Her past medical history significant for hyperlipidemia, diabetes, and seasonal allergies.  Her surgical history significant for C-section, left ear surgeries, right oophorectomy, and lithotripsy for kidney stones.  She denies use of blood thinning medications.  She denies use of tobacco products, alcohol, and illicit drugs.  Past Medical History:  Diagnosis Date   Deafness in left ear    Diabetes mellitus without complication ([place])    Diverticulitis    Gallstones    Hyperlipidemia    Sleep apnea    does not use cpap    Past Surgical History:  Procedure Laterality Date   CESAREAN SECTION     COLONOSCOPY WITH PROPOFOL N/A 12/31/2019   rourk: diverticulosis. next colonoscopy at age 61   [place], URETEROSCOPY AND STENT PLACEMENT Left 04/09/2021   Procedure: CYSTOSCOPY WITH RETROGRADE PYELOGRAM, URETEROSCOPY WITH STENT PLACEMENT;  Surgeon: Cleon Gustin, MD;  Location: [facility];  Service: Urology;  Laterality: Left;   CYSTOSCOPY/RETROGRADE/URETEROSCOPY Right 04/09/2021    Procedure: CYSTOSCOPY/RETROGRADE/URETEROSCOPY;  Surgeon: Cleon Gustin, MD;  Location: [facility];  Service: Urology;  Laterality: Right;   LAPAROSCOPIC SALPINGO OOPHERECTOMY Right 10/09/2013   Procedure: LAPAROSCOPIC RIGHT OOPHORECTOMY;  Surgeon: Jonnie Kind, MD;  Location: [facility];  Service: Gynecology;  Laterality: Right;   LT.EAR SURGERY      Family History  Problem Relation Age of Onset   Diabetes Mother    Hypertension Mother    Hypertension Father    Hypertension Sister    Hypertension Paternal Grandmother    Hypertension Sister    Diabetes Sister    Diverticulitis Other        Multiple family members   Colon cancer Neg Hx     Social History   Tobacco Use   Smoking status: Never   Smokeless tobacco: Never   Tobacco comments:    never used snuff or chewing tobacco.  Vaping Use   Vaping Use: Never used  Substance Use Topics   Alcohol use: No   Drug use: No    Medications: I have reviewed the patient's current medications. Allergies as of 05/27/2022       Reactions   Codeine Itching, Rash, Other (See Comments)        Medication List        Accurate as of May 27, 2022  9:32 AM. If you have any questions, ask your nurse or doctor.          STOP taking these medications    amoxicillin-clavulanate 875-125 MG tablet Commonly known as: AUGMENTIN Stopped by: Tiffany Frieson A [name], DO   polyethylene glycol powder 17  GM/SCOOP powder Commonly known as: GLYCOLAX/MIRALAX Stopped by: Hilman Kissling A Cass Edinger, DO       TAKE these medications    atorvastatin 40 MG tablet Commonly known as: LIPITOR Take 40 mg by mouth daily. What changed: Another medication with the same name was removed. Continue taking this medication, and follow the directions you see here. Changed by: Adithya Difrancesco A Demitris Pokorny, DO   etonogestrel 68 MG Impl implant Commonly known as: NEXPLANON 68 mg by Subdermal route once.   metFORMIN 500 MG 24 hr tablet Commonly known as:  GLUCOPHAGE-XR Take 500 mg by mouth 2 (two) times daily.   montelukast 10 MG tablet Commonly known as: SINGULAIR Take 10 mg by mouth at bedtime.   ondansetron 4 MG disintegrating tablet Commonly known as: ZOFRAN-ODT Take 1 tablet (4 mg total) by mouth every 8 (eight) hours as needed for nausea or vomiting.   oxyCODONE 5 MG immediate release tablet Commonly known as: Roxicodone Take 1 tablet (5 mg total) by mouth every 4 (four) hours as needed for severe pain.   Vitamin D 50 MCG (2000 UT) Caps Take 2,000 Units by mouth daily.   ZyrTEC Allergy 10 MG tablet Generic drug: cetirizine Take 10 mg by mouth daily.         ROS:  Constitutional: negative for chills, fatigue, and fevers Eyes: negative for visual disturbance and pain Ears, nose, mouth, throat, and face: negative for ear drainage, sore throat, and sinus problems Respiratory: negative for cough, wheezing, and shortness of breath Cardiovascular: negative for chest pain and palpitations Gastrointestinal: positive for nausea and reflux symptoms, negative for abdominal pain and vomiting Genitourinary:negative for dysuria and frequency Integument/breast: negative for dryness and rash Hematologic/lymphatic: negative for bleeding and lymphadenopathy Musculoskeletal:negative for back pain and neck pain Neurological: positive for dizziness Endocrine: negative for temperature intolerance  Blood pressure 121/81, pulse 64, temperature 98.4 F (36.9 C), temperature source Oral, resp. rate 19, height 5\' 9"  (1.753 m), weight 288 lb (130.6 kg), SpO2 95 %. Physical Exam Vitals reviewed.  Constitutional:      Appearance: Normal appearance.  HENT:     Head: Normocephalic and atraumatic.  Eyes:     Extraocular Movements: Extraocular movements intact.     Pupils: Pupils are equal, round, and reactive to light.  Cardiovascular:     Rate and Rhythm: Normal rate and regular rhythm.  Pulmonary:     Effort: Pulmonary effort is normal.      Breath sounds: Normal breath sounds.  Abdominal:     Comments: Abdomen soft, nondistended, no percussion tenderness, nontender to palpation; no rigidity, guarding, rebound tenderness; negative Murphy sign  Musculoskeletal:        General: Normal range of motion.     Cervical back: Normal range of motion.  Skin:    General: Skin is warm and dry.  Neurological:     General: No focal deficit present.     Mental Status: She is alert and oriented to person, place, and time.  Psychiatric:        Mood and Affect: Mood normal.        Behavior: Behavior normal.     Results: CT abdomen pelvis (05/12/2022):  IMPRESSION: 1. Acute uncomplicated sigmoid diverticulitis. No perforation, fluid collection, or abscess. 2. Cholelithiasis without cholecystitis.   Assessment & Plan:  Ashtan Paynter is a 37 y.o. female who presents for evaluation of cholelithiasis and diverticulitis.  -Explained the pathophysiology of gallbladder disease, and why we recommend surgical excision.  We also discussed diverticulitis.  While the patient's symptoms are resolving, given her recent diagnosis of diverticulitis, we will schedule cholecystectomy in 2 weeks to allow any residual inflammation to subside -I counseled the patient about the indication, risks and benefits of robotic assisted laparoscopic cholecystectomy.  She understands there is a very small chance for bleeding, infection, injury to normal structures (including common bile duct), conversion to open surgery, persistent symptoms, evolution of postcholecystectomy diarrhea, need for secondary interventions, anesthesia reaction, cardiopulmonary issues and other risks not specifically detailed here. I described the expected recovery, the plan for follow-up and the restrictions during the recovery phase.  All questions were answered. -Patient tentatively scheduled for surgery on 4/12.  She was also given information to obtain financial assistance, as she  currently has no insurance -Information provided to the patient regarding cholecystectomy, cholelithiasis, and low-fat diet -Advised patient to present to the emergency department if she begins to have fever, chills, worsening abdominal pain, nausea, and vomiting -Also advised patient to call our office if we need to reschedule  All questions were answered to the satisfaction of the patient.  Graciella Freer, DO [facility] [address] [place], [place] [address] [phone] (office)

## 2022-06-01 ENCOUNTER — Telehealth: Payer: Self-pay | Admitting: *Deleted

## 2022-06-01 NOTE — Telephone Encounter (Signed)
Surgical Date: 06/11/2022 Procedure: XI ROBOTIC ASSISTED LAPAROSCOPIC CHOLECYSTECTOMY CPT: [id number]     ICD 10: K80.20  Received call from patient [phone]~ telephone.   Patient reports that she has talked with Yehuda Savannah, Restaurant manager, fast food. States that she was advised that application may take up to 4 weeks for final decision.   States that she would like to proceed with surgery on 06/11/2022 though financial aid may not be in effect at that time. Advised that if patient is not approved for financial aid, she will be solely responsible for bills from [employer], [place], and anesthesia .   Patient verbalized understanding.

## 2022-06-02 IMAGING — DX DG ABDOMEN 1V
2 series · 2 of 2 positions shown · non-contrast
Comparison: None.

CLINICAL DATA: Nephrolithiasis, right abdominal pain

EXAM:
ABDOMEN - 1 VIEW

[abdomen kub (1 of 2)]
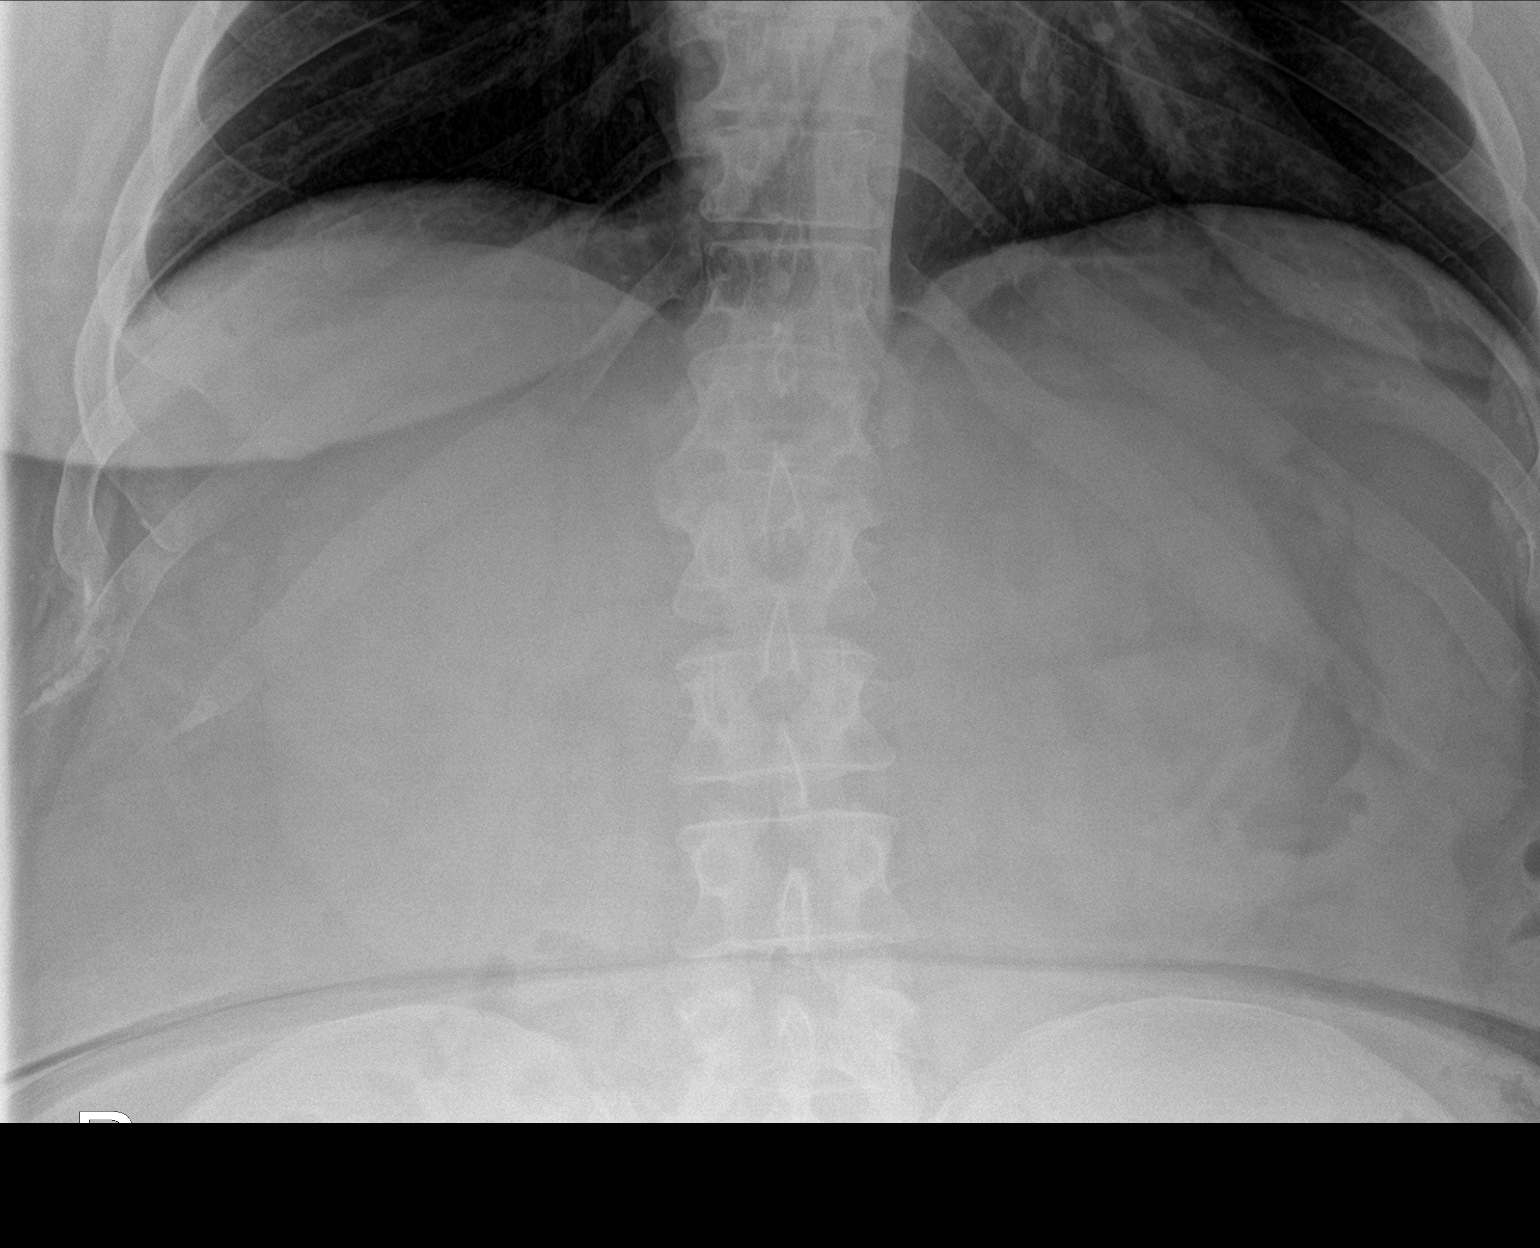

[abdomen kub (2 of 2)]
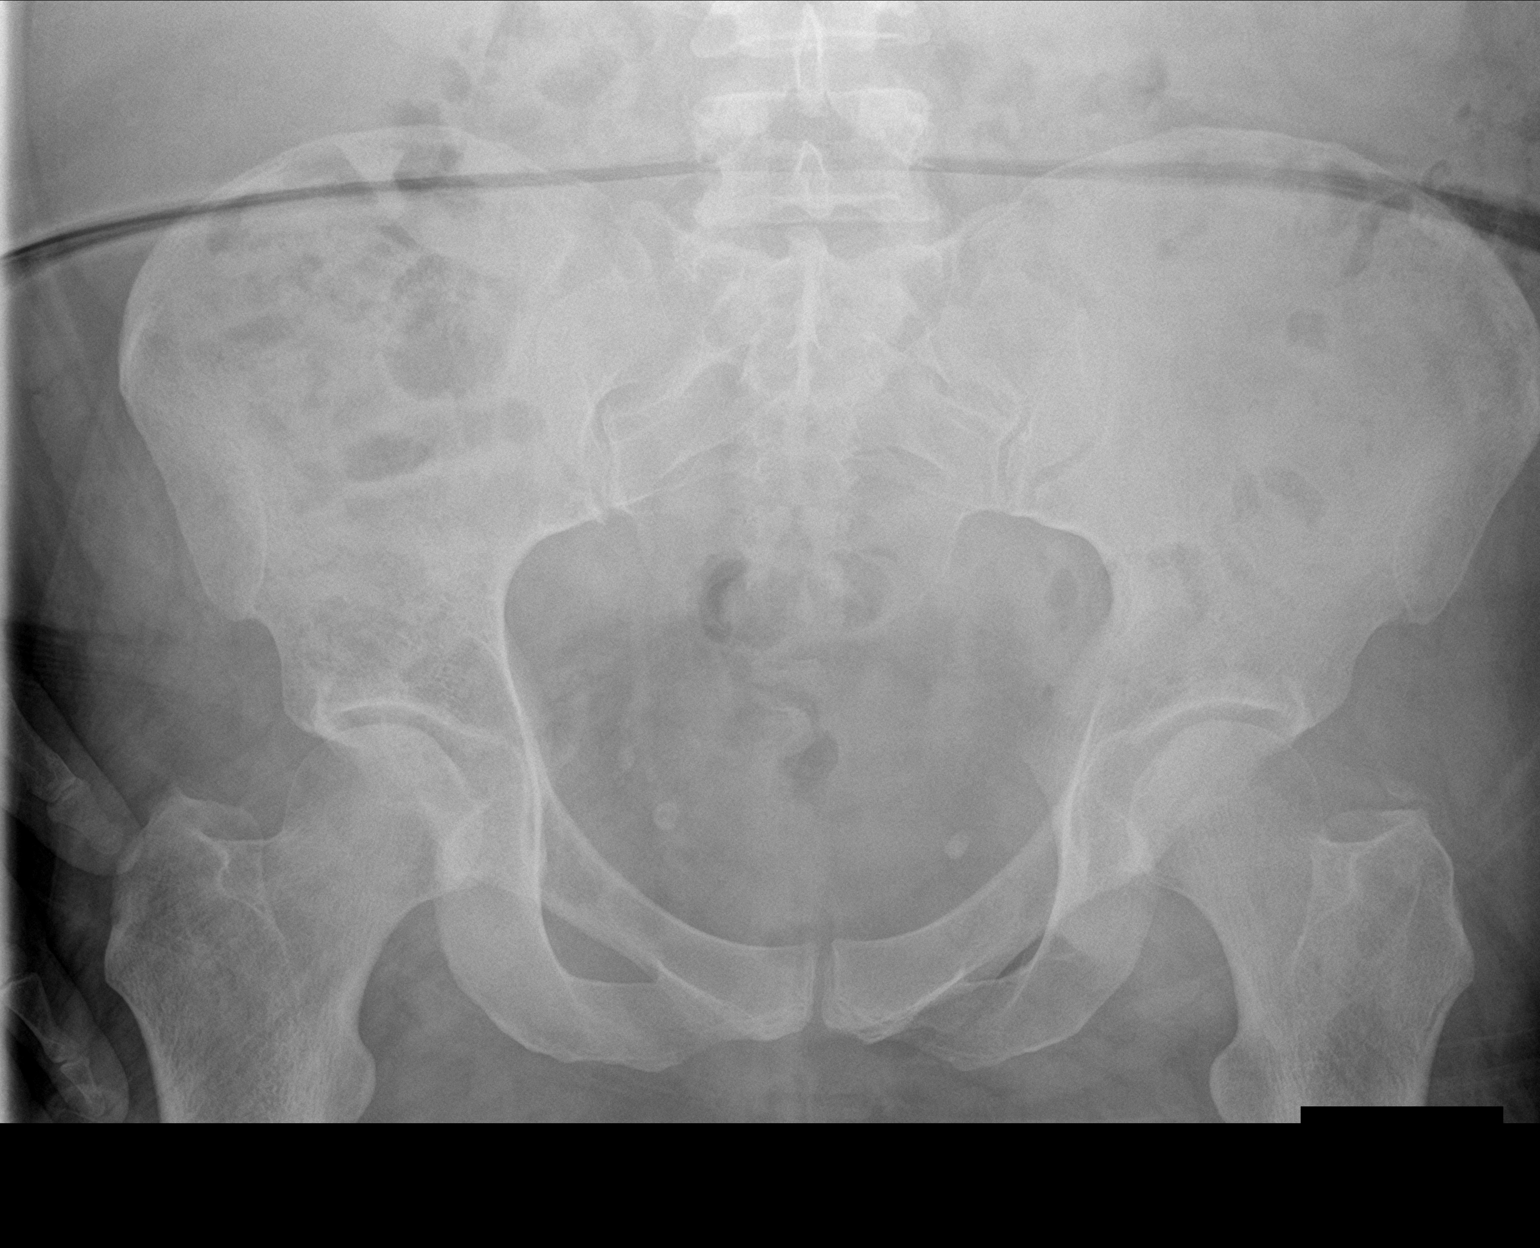

[2 of 2 positions shown; findings below may reference images not displayed]

FINDINGS: Normal abdominal gas pattern. No gross free intraperitoneal gas.
Mild hepatomegaly. No nephro or urolithiasis. Multiple phleboliths
noted within the pelvis. No acute bone abnormality.
IMPRESSION: No nephro or urolithiasis.

Mild hepatomegaly.

## 2022-06-08 NOTE — Patient Instructions (Signed)
Tiffany Ward  06/08/2022     @PREFPERIOPPHARMACY @   Your procedure is scheduled on  06/11/2022.   Report to [facility] at  0725  A.M.   Call this number if you have problems the morning of surgery:  [phone]  If you experience any cold or flu symptoms such as cough, fever, chills, shortness of breath, etc. between now and your scheduled surgery, please notify us at the above number.   Remember:  Do not eat or drink after midnight.       DO NOT take metformin the morning of your procedure.     Take these medicines the morning of surgery with A SIP OF WATER                                            None.     Do not wear jewelry, make-up or nail polish.  Do not wear lotions, powders, or perfumes, or deodorant.  Do not shave 48 hours prior to surgery.  Men may shave face and neck.  Do not bring valuables to the hospital.  [facility] is not responsible for any belongings or valuables.  Contacts, dentures or bridgework may not be worn into surgery.  Leave your suitcase in the car.  After surgery it may be brought to your room.  For patients admitted to the hospital, discharge time will be determined by your treatment team.  Patients discharged the day of surgery will not be allowed to drive home and must have someone with them for 24 hours.    Special instructions:   DO NOT smoke tobacco or vape for 24 hours before your procedure.  Please read over the following fact sheets that you were given. Pain Booklet, Coughing and Deep Breathing, Surgical Site Infection Prevention, Anesthesia Post-op Instructions, and Care and Recovery After Surgery      Minimally Invasive Cholecystectomy, Care After The following information offers guidance on how to care for yourself after your procedure. Your health care provider may also give you more specific instructions. If you have problems or questions, contact your health care provider. What can I expect after the  procedure? After the procedure, it is common to have: Pain at your incision sites. You will be given medicines to control this pain. Mild nausea or vomiting. Bloating and possible shoulder pain from the gas that was used during the procedure. Follow these instructions at home: Medicines Take over-the-counter and prescription medicines only as told by your health care provider. If you were prescribed an antibiotic medicine, take it as told by your health care provider. Do not stop using the antibiotic even if you start to feel better. Ask your health care provider if the medicine prescribed to you: Requires you to avoid driving or using machinery. Can cause constipation. You may need to take these actions to prevent or treat constipation: Drink enough fluid to keep your urine pale yellow. Take over-the-counter or prescription medicines. Eat foods that are high in fiber, such as beans, whole grains, and fresh fruits and vegetables. Limit foods that are high in fat and processed sugars, such as fried or sweet foods. Incision care  Follow instructions from your health care provider about how to take care of your incisions. Make sure you: Wash your hands with soap and water for at least 20 seconds before  and after you change your bandage (dressing). If soap and water are not available, use hand sanitizer. Change your dressing as told by your health care provider. Leave stitches (sutures), skin glue, or adhesive strips in place. These skin closures may need to be in place for 2 weeks or longer. If adhesive strip edges start to loosen and curl up, you may trim the loose edges. Do not remove adhesive strips completely unless your health care provider tells you to do that. Do not take baths, swim, or use a hot tub until your health care provider approves. Ask your health care provider if you may take showers. You may only be allowed to take sponge baths. Check your incision area every day for signs of  infection. Check for: More redness, swelling, or pain. Fluid or blood. Warmth. Pus or a bad smell. Activity Rest as told by your health care provider. Do not do activities that require a lot of effort. Avoid sitting for a long time without moving. Get up to take short walks every 1-2 hours. This is important to improve blood flow and breathing. Ask for help if you feel weak or unsteady. Do not lift anything that is heavier than 10 lb (4.5 kg), or the limit that you are told, until your health care provider says that it is safe. Do not play contact sports until your health care provider approves. Do not return to work or school until your health care provider approves. Return to your normal activities as told by your health care provider. Ask your health care provider what activities are safe for you. General instructions If you were given a sedative during the procedure, it can affect you for several hours. Do not drive or operate machinery until your health care provider says that it is safe. Keep all follow-up visits. This is important. Contact a health care provider if: You develop a rash. You have more redness, swelling, or pain around your incisions. You have fluid or blood coming from your incisions. Your incisions feel warm to the touch. You have pus or a bad smell coming from your incisions. You have a fever. One or more of your incisions breaks open. Get help right away if: You have trouble breathing. You have chest pain. You have more pain in your shoulders. You faint or feel dizzy when you stand. You have severe pain in your abdomen. You have nausea or vomiting that lasts for more than one day. You have leg pain that is new or unusual, or if it is localized to one specific spot. These symptoms may represent a serious problem that is an emergency. Do not wait to see if the symptoms will go away. Get medical help right away. Call your local emergency services (911 in the  [place] Do not drive yourself to the hospital. Summary After your procedure, it is common to have pain at the incision sites. You may also have nausea or bloating. Follow your health care provider's instructions about medicine, activity restrictions, and caring for your incision areas. Do not do activities that require a lot of effort. Contact a health care provider if you have a fever or other signs of infection, such as more redness, swelling, or pain around the incisions. Get help right away if you have chest pain, increasing pain in the shoulders, or trouble breathing. This information is not intended to replace advice given to you by your health care provider. Make sure you discuss any questions you have with your health  care provider. Document Revised: 08/19/2020 Document Reviewed: 08/19/2020 Elsevier Patient Education  2023 [employer] General Anesthesia, Adult, Care After The following information offers guidance on how to care for yourself after your procedure. Your health care provider may also give you more specific instructions. If you have problems or questions, contact your health care provider. What can I expect after the procedure? After the procedure, it is common for people to: Have pain or discomfort at the IV site. Have nausea or vomiting. Have a sore throat or hoarseness. Have trouble concentrating. Feel cold or chills. Feel weak, sleepy, or tired (fatigue). Have soreness and body aches. These can affect parts of the body that were not involved in surgery. Follow these instructions at home: For the time period you were told by your health care provider:  Rest. Do not participate in activities where you could fall or become injured. Do not drive or use machinery. Do not drink alcohol. Do not take sleeping pills or medicines that cause drowsiness. Do not make important decisions or sign legal documents. Do not take care of children on your own. General  instructions Drink enough fluid to keep your urine pale yellow. If you have sleep apnea, surgery and certain medicines can increase your risk for breathing problems. Follow instructions from your health care provider about wearing your sleep device: Anytime you are sleeping, including during daytime naps. While taking prescription pain medicines, sleeping medicines, or medicines that make you drowsy. Return to your normal activities as told by your health care provider. Ask your health care provider what activities are safe for you. Take over-the-counter and prescription medicines only as told by your health care provider. Do not use any products that contain nicotine or tobacco. These products include cigarettes, chewing tobacco, and vaping devices, such as e-cigarettes. These can delay incision healing after surgery. If you need help quitting, ask your health care provider. Contact a health care provider if: You have nausea or vomiting that does not get better with medicine. You vomit every time you eat or drink. You have pain that does not get better with medicine. You cannot urinate or have bloody urine. You develop a skin rash. You have a fever. Get help right away if: You have trouble breathing. You have chest pain. You vomit blood. These symptoms may be an emergency. Get help right away. Call 911. Do not wait to see if the symptoms will go away. Do not drive yourself to the hospital. Summary After the procedure, it is common to have a sore throat, hoarseness, nausea, vomiting, or to feel weak, sleepy, or fatigue. For the time period you were told by your health care provider, do not drive or use machinery. Get help right away if you have difficulty breathing, have chest pain, or vomit blood. These symptoms may be an emergency. This information is not intended to replace advice given to you by your health care provider. Make sure you discuss any questions you have with your health  care provider. Document Revised: 05/15/2021 Document Reviewed: 05/15/2021 Elsevier Patient Education  2023 [employer] How to Use Chlorhexidine Before Surgery Chlorhexidine gluconate (CHG) is a germ-killing (antiseptic) solution that is used to clean the skin. It can get rid of the bacteria that normally live on the skin and can keep them away for about 24 hours. To clean your skin with CHG, you may be given: A CHG solution to use in the shower or as part of a sponge bath. A prepackaged cloth that  contains CHG. Cleaning your skin with CHG may help lower the risk for infection: While you are staying in the intensive care unit of the hospital. If you have a vascular access, such as a central line, to provide short-term or long-term access to your veins. If you have a catheter to drain urine from your bladder. If you are on a ventilator. A ventilator is a machine that helps you breathe by moving air in and out of your lungs. After surgery. What are the risks? Risks of using CHG include: A skin reaction. Hearing loss, if CHG gets in your ears and you have a perforated eardrum. Eye injury, if CHG gets in your eyes and is not rinsed out. The CHG product catching fire. Make sure that you avoid smoking and flames after applying CHG to your skin. Do not use CHG: If you have a chlorhexidine allergy or have previously reacted to chlorhexidine. On babies younger than 28 months of age. How to use CHG solution Use CHG only as told by your health care provider, and follow the instructions on the label. Use the full amount of CHG as directed. Usually, this is one bottle. During a shower Follow these steps when using CHG solution during a shower (unless your health care provider gives you different instructions): Start the shower. Use your normal soap and shampoo to wash your face and hair. Turn off the shower or move out of the shower stream. Pour the CHG onto a clean washcloth. Do not use any type  of brush or rough-edged sponge. Starting at your neck, lather your body down to your toes. Make sure you follow these instructions: If you will be having surgery, pay special attention to the part of your body where you will be having surgery. Scrub this area for at least 1 minute. Do not use CHG on your head or face. If the solution gets into your ears or eyes, rinse them well with water. Avoid your genital area. Avoid any areas of skin that have broken skin, cuts, or scrapes. Scrub your back and under your arms. Make sure to wash skin folds. Let the lather sit on your skin for 1-2 minutes or as long as told by your health care provider. Thoroughly rinse your entire body in the shower. Make sure that all body creases and crevices are rinsed well. Dry off with a clean towel. Do not put any substances on your body afterward--such as powder, lotion, or perfume--unless you are told to do so by your health care provider. Only use lotions that are recommended by the manufacturer. Put on clean clothes or pajamas. If it is the night before your surgery, sleep in clean sheets.  During a sponge bath Follow these steps when using CHG solution during a sponge bath (unless your health care provider gives you different instructions): Use your normal soap and shampoo to wash your face and hair. Pour the CHG onto a clean washcloth. Starting at your neck, lather your body down to your toes. Make sure you follow these instructions: If you will be having surgery, pay special attention to the part of your body where you will be having surgery. Scrub this area for at least 1 minute. Do not use CHG on your head or face. If the solution gets into your ears or eyes, rinse them well with water. Avoid your genital area. Avoid any areas of skin that have broken skin, cuts, or scrapes. Scrub your back and under your arms. Make sure to  wash skin folds. Let the lather sit on your skin for 1-2 minutes or as long as told by  your health care provider. Using a different clean, wet washcloth, thoroughly rinse your entire body. Make sure that all body creases and crevices are rinsed well. Dry off with a clean towel. Do not put any substances on your body afterward--such as powder, lotion, or perfume--unless you are told to do so by your health care provider. Only use lotions that are recommended by the manufacturer. Put on clean clothes or pajamas. If it is the night before your surgery, sleep in clean sheets. How to use CHG prepackaged cloths Only use CHG cloths as told by your health care provider, and follow the instructions on the label. Use the CHG cloth on clean, dry skin. Do not use the CHG cloth on your head or face unless your health care provider tells you to. When washing with the CHG cloth: Avoid your genital area. Avoid any areas of skin that have broken skin, cuts, or scrapes. Before surgery Follow these steps when using a CHG cloth to clean before surgery (unless your health care provider gives you different instructions): Using the CHG cloth, vigorously scrub the part of your body where you will be having surgery. Scrub using a back-and-forth motion for 3 minutes. The area on your body should be completely wet with CHG when you are done scrubbing. Do not rinse. Discard the cloth and let the area air-dry. Do not put any substances on the area afterward, such as powder, lotion, or perfume. Put on clean clothes or pajamas. If it is the night before your surgery, sleep in clean sheets.  For general bathing Follow these steps when using CHG cloths for general bathing (unless your health care provider gives you different instructions). Use a separate CHG cloth for each area of your body. Make sure you wash between any folds of skin and between your fingers and toes. Wash your body in the following order, switching to a new cloth after each step: The front of your neck, shoulders, and chest. Both of your  arms, under your arms, and your hands. Your stomach and groin area, avoiding the genitals. Your right leg and foot. Your left leg and foot. The back of your neck, your back, and your buttocks. Do not rinse. Discard the cloth and let the area air-dry. Do not put any substances on your body afterward--such as powder, lotion, or perfume--unless you are told to do so by your health care provider. Only use lotions that are recommended by the manufacturer. Put on clean clothes or pajamas. Contact a health care provider if: Your skin gets irritated after scrubbing. You have questions about using your solution or cloth. You swallow any chlorhexidine. Call your local poison control center [phone])111-2187 in the [place] Get help right away if: Your eyes itch badly, or they become very red or swollen. Your skin itches badly and is red or swollen. Your hearing changes. You have trouble seeing. You have swelling or tingling in your mouth or throat. You have trouble breathing. These symptoms may represent a serious problem that is an emergency. Do not wait to see if the symptoms will go away. Get medical help right away. Call your local emergency services (911 in the [place] Do not drive yourself to the hospital. Summary Chlorhexidine gluconate (CHG) is a germ-killing (antiseptic) solution that is used to clean the skin. Cleaning your skin with CHG may help to lower your risk for infection.  You may be given CHG to use for bathing. It may be in a bottle or in a prepackaged cloth to use on your skin. Carefully follow your health care provider's instructions and the instructions on the product label. Do not use CHG if you have a chlorhexidine allergy. Contact your health care provider if your skin gets irritated after scrubbing. This information is not intended to replace advice given to you by your health care provider. Make sure you discuss any questions you have with your health care provider. Document  Revised: 06/15/2021 Document Reviewed: 04/28/2020 Elsevier Patient Education  2023 [employer].

## 2022-06-09 ENCOUNTER — Encounter (HOSPITAL_COMMUNITY)
Admission: RE | Admit: 2022-06-09 | Discharge: 2022-06-09 | Disposition: A | Discharge status: Home / Self Care | Payer: MEDICAID | Source: Ambulatory Visit | Attending: Surgery | Admitting: Surgery

## 2022-06-09 ENCOUNTER — Encounter (HOSPITAL_COMMUNITY): Payer: Self-pay

## 2022-06-09 VITALS — BP 134/82 | HR 57 | Temp 98.1°F | Resp 18 | Ht 69.0 in | Wt 288.0 lb

## 2022-06-09 DIAGNOSIS — Z01818 Encounter for other preprocedural examination: Secondary | ICD-10-CM | POA: Insufficient documentation

## 2022-06-09 DIAGNOSIS — E119 Type 2 diabetes mellitus without complications: Secondary | ICD-10-CM | POA: Insufficient documentation

## 2022-06-09 HISTORY — DX: Fatty (change of) liver, not elsewhere classified: K76.0

## 2022-06-09 HISTORY — DX: Other specified postprocedural states: Z98.890

## 2022-06-09 HISTORY — DX: Personal history of urinary calculi: Z87.442

## 2022-06-09 LAB — POCT PREGNANCY, URINE: Preg Test, Ur: NEGATIVE

## 2022-06-09 LAB — HEMOGLOBIN A1C
Hgb A1c MFr Bld: 8.3 % — ABNORMAL HIGH (ref 4.8–5.6)
Mean Plasma Glucose: 191.51 mg/dL

## 2022-06-11 ENCOUNTER — Ambulatory Visit (HOSPITAL_COMMUNITY)
Admission: RE | Admit: 2022-06-11 | Discharge: 2022-06-11 | Disposition: A | Discharge status: Home / Self Care | Payer: Self-pay | Attending: Surgery | Admitting: Surgery

## 2022-06-11 ENCOUNTER — Ambulatory Visit (HOSPITAL_COMMUNITY): Payer: Self-pay

## 2022-06-11 ENCOUNTER — Encounter (HOSPITAL_COMMUNITY): Payer: Self-pay | Admitting: Surgery

## 2022-06-11 ENCOUNTER — Encounter (HOSPITAL_COMMUNITY)
Admission: RE | Disposition: A | Discharge status: Home / Self Care | Payer: Self-pay | Source: Home / Self Care | Attending: Surgery

## 2022-06-11 ENCOUNTER — Ambulatory Visit (HOSPITAL_BASED_OUTPATIENT_CLINIC_OR_DEPARTMENT_OTHER): Payer: Self-pay

## 2022-06-11 ENCOUNTER — Other Ambulatory Visit: Payer: Self-pay

## 2022-06-11 DIAGNOSIS — E785 Hyperlipidemia, unspecified: Secondary | ICD-10-CM | POA: Insufficient documentation

## 2022-06-11 DIAGNOSIS — Z6841 Body Mass Index (BMI) 40.0 and over, adult: Secondary | ICD-10-CM | POA: Insufficient documentation

## 2022-06-11 DIAGNOSIS — Z7984 Long term (current) use of oral hypoglycemic drugs: Secondary | ICD-10-CM | POA: Insufficient documentation

## 2022-06-11 DIAGNOSIS — G4730 Sleep apnea, unspecified: Secondary | ICD-10-CM | POA: Insufficient documentation

## 2022-06-11 DIAGNOSIS — E119 Type 2 diabetes mellitus without complications: Secondary | ICD-10-CM | POA: Insufficient documentation

## 2022-06-11 DIAGNOSIS — Z90721 Acquired absence of ovaries, unilateral: Secondary | ICD-10-CM | POA: Insufficient documentation

## 2022-06-11 DIAGNOSIS — K8020 Calculus of gallbladder without cholecystitis without obstruction: Secondary | ICD-10-CM

## 2022-06-11 DIAGNOSIS — K8010 Calculus of gallbladder with chronic cholecystitis without obstruction: Secondary | ICD-10-CM | POA: Insufficient documentation

## 2022-06-11 LAB — GLUCOSE, CAPILLARY
Glucose-Capillary: 151 mg/dL — ABNORMAL HIGH (ref 70–99)
Glucose-Capillary: 196 mg/dL — ABNORMAL HIGH (ref 70–99)

## 2022-06-11 SURGERY — CHOLECYSTECTOMY, ROBOT-ASSISTED, LAPAROSCOPIC
Anesthesia: General | Site: Abdomen

## 2022-06-11 MED ORDER — STERILE WATER FOR IRRIGATION IR SOLN
Status: DC | PRN
  Administered 2022-06-11: 500 mL

## 2022-06-11 MED ORDER — PROPOFOL 500 MG/50ML IV EMUL
INTRAVENOUS | Status: AC
  Filled 2022-06-11: qty 50

## 2022-06-11 MED ORDER — PHENYLEPHRINE 80 MCG/ML (10ML) SYRINGE FOR IV PUSH (FOR BLOOD PRESSURE SUPPORT)
PREFILLED_SYRINGE | INTRAVENOUS | Status: AC
  Filled 2022-06-11: qty 10

## 2022-06-11 MED ORDER — METOCLOPRAMIDE HCL 5 MG/ML IJ SOLN
INTRAMUSCULAR | Status: AC
  Filled 2022-06-11: qty 2

## 2022-06-11 MED ORDER — CHLORHEXIDINE GLUCONATE 0.12 % MT SOLN
15.0000 mL | Freq: Once | OROMUCOSAL | Status: AC
  Administered 2022-06-11: 15 mL via OROMUCOSAL

## 2022-06-11 MED ORDER — FENTANYL CITRATE PF 50 MCG/ML IJ SOSY
PREFILLED_SYRINGE | INTRAMUSCULAR | Status: AC
  Filled 2022-06-11: qty 1

## 2022-06-11 MED ORDER — VECURONIUM BROMIDE 10 MG IV SOLR
INTRAVENOUS | Status: AC
  Filled 2022-06-11: qty 10

## 2022-06-11 MED ORDER — FENTANYL CITRATE (PF) 250 MCG/5ML IJ SOLN
INTRAMUSCULAR | Status: DC | PRN
  Administered 2022-06-11: 25 ug via INTRAVENOUS
  Administered 2022-06-11: 50 ug via INTRAVENOUS
  Administered 2022-06-11: 25 ug via INTRAVENOUS
  Administered 2022-06-11: 50 ug via INTRAVENOUS
  Administered 2022-06-11 (×2): 25 ug via INTRAVENOUS

## 2022-06-11 MED ORDER — SODIUM CHLORIDE 0.9 % IV SOLN
2.0000 g | INTRAVENOUS | Status: AC
  Administered 2022-06-11: 2 g via INTRAVENOUS
  Administered 2022-06-11: 1 g via INTRAVENOUS

## 2022-06-11 MED ORDER — ORAL CARE MOUTH RINSE: 15.0000 mL | Freq: Once | OROMUCOSAL | Status: AC

## 2022-06-11 MED ORDER — DEXAMETHASONE SODIUM PHOSPHATE 10 MG/ML IJ SOLN
INTRAMUSCULAR | Status: DC | PRN
  Administered 2022-06-11: 5 mg via INTRAVENOUS

## 2022-06-11 MED ORDER — LACTATED RINGERS IV SOLN: INTRAVENOUS | Status: DC

## 2022-06-11 MED ORDER — ROCURONIUM BROMIDE 10 MG/ML (PF) SYRINGE
PREFILLED_SYRINGE | INTRAVENOUS | Status: AC
  Filled 2022-06-11: qty 20

## 2022-06-11 MED ORDER — SCOPOLAMINE 1 MG/3DAYS TD PT72
MEDICATED_PATCH | TRANSDERMAL | Status: AC
  Filled 2022-06-11: qty 1

## 2022-06-11 MED ORDER — SODIUM CHLORIDE 0.9 % IR SOLN
Status: DC | PRN
  Administered 2022-06-11: 3000 mL

## 2022-06-11 MED ORDER — ONDANSETRON HCL 4 MG/2ML IJ SOLN: 4.0000 mg | Freq: Once | INTRAMUSCULAR | Status: DC | PRN

## 2022-06-11 MED ORDER — OXYCODONE HCL 5 MG PO TABS: 5.0000 mg | ORAL_TABLET | ORAL | 0 refills | Status: AC | PRN

## 2022-06-11 MED ORDER — STERILE WATER FOR INJECTION IJ SOLN
INTRAMUSCULAR | Status: AC
  Filled 2022-06-11: qty 10

## 2022-06-11 MED ORDER — PROPOFOL 10 MG/ML IV BOLUS
INTRAVENOUS | Status: DC | PRN
  Administered 2022-06-11: 200 mg via INTRAVENOUS

## 2022-06-11 MED ORDER — ACETAMINOPHEN 500 MG PO TABS: 1000.0000 mg | ORAL_TABLET | Freq: Four times a day (QID) | ORAL | 0 refills | Status: AC

## 2022-06-11 MED ORDER — SODIUM CHLORIDE 0.9 % IV SOLN
INTRAVENOUS | Status: AC
  Filled 2022-06-11: qty 2

## 2022-06-11 MED ORDER — ONDANSETRON HCL 4 MG/2ML IJ SOLN
INTRAMUSCULAR | Status: AC
  Filled 2022-06-11: qty 2

## 2022-06-11 MED ORDER — FENTANYL CITRATE (PF) 100 MCG/2ML IJ SOLN
INTRAMUSCULAR | Status: AC
  Filled 2022-06-11: qty 2

## 2022-06-11 MED ORDER — ROCURONIUM BROMIDE 10 MG/ML (PF) SYRINGE
PREFILLED_SYRINGE | INTRAVENOUS | Status: DC | PRN
  Administered 2022-06-11: 10 mg via INTRAVENOUS
  Administered 2022-06-11: 20 mg via INTRAVENOUS
  Administered 2022-06-11: 70 mg via INTRAVENOUS

## 2022-06-11 MED ORDER — CHLORHEXIDINE GLUCONATE CLOTH 2 % EX PADS
6.0000 | MEDICATED_PAD | Freq: Once | CUTANEOUS | Status: AC
  Administered 2022-06-11: 6 via TOPICAL

## 2022-06-11 MED ORDER — BUPIVACAINE HCL (PF) 0.5 % IJ SOLN
INTRAMUSCULAR | Status: AC
  Filled 2022-06-11: qty 30

## 2022-06-11 MED ORDER — INDOCYANINE GREEN 25 MG IV SOLR
INTRAVENOUS | Status: AC
  Administered 2022-06-11: 2.5 mg via INTRAVENOUS
  Filled 2022-06-11: qty 10

## 2022-06-11 MED ORDER — OXYCODONE HCL 5 MG PO TABS
5.0000 mg | ORAL_TABLET | Freq: Once | ORAL | Status: AC | PRN
  Administered 2022-06-11: 5 mg via ORAL
  Filled 2022-06-11: qty 1

## 2022-06-11 MED ORDER — LIDOCAINE HCL (PF) 2 % IJ SOLN
INTRAMUSCULAR | Status: AC
  Filled 2022-06-11: qty 5

## 2022-06-11 MED ORDER — EPHEDRINE SULFATE-NACL 50-0.9 MG/10ML-% IV SOSY
PREFILLED_SYRINGE | INTRAVENOUS | Status: DC | PRN
  Administered 2022-06-11: 5 mg via INTRAVENOUS
  Administered 2022-06-11: 10 mg via INTRAVENOUS

## 2022-06-11 MED ORDER — LIDOCAINE 2% (20 MG/ML) 5 ML SYRINGE
INTRAMUSCULAR | Status: DC | PRN
  Administered 2022-06-11: 100 mg via INTRAVENOUS

## 2022-06-11 MED ORDER — VECURONIUM BROMIDE 10 MG IV SOLR
INTRAVENOUS | Status: DC | PRN
  Administered 2022-06-11: 1 mg via INTRAVENOUS

## 2022-06-11 MED ORDER — BUPIVACAINE HCL (PF) 0.5 % IJ SOLN
INTRAMUSCULAR | Status: DC | PRN
  Administered 2022-06-11: 30 mL

## 2022-06-11 MED ORDER — FENTANYL CITRATE PF 50 MCG/ML IJ SOSY
25.0000 ug | PREFILLED_SYRINGE | INTRAMUSCULAR | Status: DC | PRN
  Administered 2022-06-11: 50 ug via INTRAVENOUS

## 2022-06-11 MED ORDER — DOCUSATE SODIUM 100 MG PO CAPS: 100.0000 mg | ORAL_CAPSULE | Freq: Two times a day (BID) | ORAL | 2 refills | Status: AC

## 2022-06-11 MED ORDER — SCOPOLAMINE 1 MG/3DAYS TD PT72
MEDICATED_PATCH | TRANSDERMAL | Status: DC | PRN
  Administered 2022-06-11: 1 via TRANSDERMAL

## 2022-06-11 MED ORDER — OXYCODONE HCL 5 MG/5ML PO SOLN: 5.0000 mg | Freq: Once | ORAL | Status: AC | PRN

## 2022-06-11 MED ORDER — INDOCYANINE GREEN 25 MG IV SOLR: 2.5000 mg | Freq: Once | INTRAVENOUS | Status: AC

## 2022-06-11 MED ORDER — ONDANSETRON HCL 4 MG/2ML IJ SOLN
INTRAMUSCULAR | Status: DC | PRN
  Administered 2022-06-11: 4 mg via INTRAVENOUS

## 2022-06-11 MED ORDER — SUGAMMADEX SODIUM 200 MG/2ML IV SOLN
INTRAVENOUS | Status: DC | PRN
  Administered 2022-06-11: 200 mg via INTRAVENOUS

## 2022-06-11 MED ORDER — MIDAZOLAM HCL 5 MG/5ML IJ SOLN
INTRAMUSCULAR | Status: DC | PRN
  Administered 2022-06-11: 2 mg via INTRAVENOUS

## 2022-06-11 MED ORDER — DEXAMETHASONE SODIUM PHOSPHATE 10 MG/ML IJ SOLN
INTRAMUSCULAR | Status: AC
  Filled 2022-06-11: qty 1

## 2022-06-11 MED ORDER — METOCLOPRAMIDE HCL 5 MG/ML IJ SOLN
INTRAMUSCULAR | Status: DC | PRN
  Administered 2022-06-11: 10 mg via INTRAVENOUS

## 2022-06-11 MED ORDER — CHLORHEXIDINE GLUCONATE CLOTH 2 % EX PADS: 6.0000 | MEDICATED_PAD | Freq: Once | CUTANEOUS | Status: DC

## 2022-06-11 MED ORDER — ROCURONIUM BROMIDE 10 MG/ML (PF) SYRINGE
PREFILLED_SYRINGE | INTRAVENOUS | Status: AC
  Filled 2022-06-11: qty 10

## 2022-06-11 MED ORDER — PROPOFOL 500 MG/50ML IV EMUL
INTRAVENOUS | Status: AC
  Filled 2022-06-11: qty 100

## 2022-06-11 MED ORDER — PHENYLEPHRINE HCL (PRESSORS) 10 MG/ML IV SOLN
INTRAVENOUS | Status: DC | PRN
  Administered 2022-06-11 (×3): 80 ug via INTRAVENOUS
  Administered 2022-06-11: 160 ug via INTRAVENOUS

## 2022-06-11 MED ORDER — EPHEDRINE 5 MG/ML INJ
INTRAVENOUS | Status: AC
  Filled 2022-06-11: qty 5

## 2022-06-11 MED ORDER — MIDAZOLAM HCL 2 MG/2ML IJ SOLN
INTRAMUSCULAR | Status: AC
  Filled 2022-06-11: qty 2

## 2022-06-11 SURGICAL SUPPLY — 45 items
ADH SKN CLS APL DERMABOND .7 (GAUZE/BANDAGES/DRESSINGS) ×1
APL PRP STRL LF DISP 70% ISPRP (MISCELLANEOUS) ×1
BLADE SURG 15 STRL LF DISP TIS (BLADE) ×1 IMPLANT
BLADE SURG 15 STRL SS (BLADE) ×1
CAUTERY HOOK MNPLR 1.6 DVNC XI (INSTRUMENTS) ×2 IMPLANT
CHLORAPREP W/TINT 26 (MISCELLANEOUS) ×1 IMPLANT
CLIP LIGATING HEM O LOK PURPLE (MISCELLANEOUS) ×1 IMPLANT
COVER TIP SHEARS 8 DVNC (MISCELLANEOUS) ×1 IMPLANT
DEFOGGER SCOPE WARMER CLEARIFY (MISCELLANEOUS) IMPLANT
DERMABOND ADVANCED .7 DNX12 (GAUZE/BANDAGES/DRESSINGS) ×1 IMPLANT
DRAPE ARM DVNC X/XI (DISPOSABLE) ×4 IMPLANT
DRAPE COLUMN DVNC XI (DISPOSABLE) ×1 IMPLANT
ELECT REM PT RETURN 9FT ADLT (ELECTROSURGICAL) ×1
ELECTRODE REM PT RTRN 9FT ADLT (ELECTROSURGICAL) ×1 IMPLANT
FORCEPS BPLR R/ABLATION 8 DVNC (INSTRUMENTS) ×1 IMPLANT
FORCEPS PROGRASP DVNC XI (FORCEP) ×2 IMPLANT
GLOVE BIO SURGEON STRL SZ 6.5 (GLOVE) IMPLANT
GLOVE BIO SURGEON STRL SZ7 (GLOVE) IMPLANT
GLOVE BIOGEL PI IND STRL 6.5 (GLOVE) ×2 IMPLANT
GLOVE BIOGEL PI IND STRL 7.0 (GLOVE) ×3 IMPLANT
GLOVE SURG SS PI 6.5 STRL IVOR (GLOVE) ×2 IMPLANT
GOWN STRL REUS W/TWL LRG LVL3 (GOWN DISPOSABLE) ×3 IMPLANT
GRASPER SUT TROCAR 14GX15 (MISCELLANEOUS) ×1 IMPLANT
IRRIGATOR SUCT 8 DISP DVNC XI (IRRIGATION / IRRIGATOR) IMPLANT
IV NS IRRIG 3000ML ARTHROMATIC (IV SOLUTION) IMPLANT
MANIFOLD NEPTUNE II (INSTRUMENTS) ×1 IMPLANT
NDL HYPO 21X1.5 SAFETY (NEEDLE) ×1 IMPLANT
NDL INSUFFLATION 14GA 120MM (NEEDLE) ×1 IMPLANT
NEEDLE HYPO 21X1.5 SAFETY (NEEDLE) ×2 IMPLANT
NEEDLE INSUFFLATION 14GA 120MM (NEEDLE) ×1 IMPLANT
OBTURATOR OPTICAL STND 8 DVNC (TROCAR) ×1
OBTURATOR OPTICALSTD 8 DVNC (TROCAR) ×1 IMPLANT
PACK LAP CHOLE LZT030E (CUSTOM PROCEDURE TRAY) ×1 IMPLANT
PAD ARMBOARD 7.5X6 YLW CONV (MISCELLANEOUS) ×1 IMPLANT
PENCIL HANDSWITCHING (ELECTRODE) IMPLANT
SCISSORS MNPLR CVD DVNC XI (INSTRUMENTS) ×2 IMPLANT
SEAL CANN UNIV 5-8 DVNC XI (MISCELLANEOUS) ×3 IMPLANT
SET BASIN LINEN APH (SET/KITS/TRAYS/PACK) ×1 IMPLANT
SET TUBE SMOKE EVAC HIGH FLOW (TUBING) ×1 IMPLANT
SUT MNCRL AB 4-0 PS2 18 (SUTURE) ×2 IMPLANT
SUT VICRYL 0 AB UR-6 (SUTURE) IMPLANT
SYR 30ML LL (SYRINGE) ×1 IMPLANT
SYS RETRIEVAL 5MM INZII UNIV (BASKET) ×1
SYSTEM RETRIEVL 5MM INZII UNIV (BASKET) IMPLANT
WATER STERILE IRR 500ML POUR (IV SOLUTION) ×1 IMPLANT

## 2022-06-11 NOTE — Op Note (Signed)
[facility] Operative Note  06/11/22  Preoperative Diagnosis: Symptomatic Cholelithiasis   Postoperative Diagnosis: Same   Procedure(s) Performed: Robotic Assisted Laparoscopic Cholecystectomy   Surgeon: Theophilus Kinds, DO   Assistants: No qualified resident was available    Anesthesia: General endotracheal   Anesthesiologist: Windell Norfolk, MD    Specimens: Gallbladder   Estimated Blood Loss: Minimal   Blood Replacement: None    Complications: None   Wound Class: Contaminated   Operative Indications: The patient was found to have cholelithiasis on imaging and was symptomatic.  We discussed the risk of the procedure including but not limited to bleeding, infection, injury to the common bile duct, bile leak, need for further procedures, chance of subtotal cholecystectomy.   Findings:  Distended gallbladder without significant inflammation Critical view of safety noted All clips intact at the end of the case Adequate hemostasis   Procedure: Firefly was given in the preoperative area. The patient was taken to the operating room and placed supine. General endotracheal anesthesia was induced. Intravenous antibiotics were administered per protocol.  An orogastric tube positioned to decompress the stomach. The abdomen was prepared and draped in the usual sterile fashion.  A time-out was completed verifying correct patient, procedure, site, positioning, and implant(s) and/or special equipment prior to beginning this procedure.  Veress needle was placed at the supraumbilical area and insufflation was started after confirming a positive saline drop test and no immediate increase in abdominal pressure.  After reaching 15 mm, the Veress needle was removed and a 8 mm port was placed via optiview technique supraumbilical, measuring 20 mm away from the suspected position of the gallbladder.  The abdomen was inspected and no abnormalities or injuries were found.   Under direct vision, ports were placed in the following locations in a semi curvilinear position around the target of the gallbladder: Two 8 mm ports on the patient's right each having 8cm clearance to the adjacent ports and one 8 mm port placed on the patient's left 8 cm from the umbilical port. Once ports were placed, the table was placed in the reverse Trendelenburg position with the right side up. The Xi platform was brought into the operative field and docked to the ports successfully.  An endoscope was placed through the umbilical port, prograsp through the most lateral right port, fenestrated bipolar to the port just right of the umbilicus, and then a hook cautery in the left port.  The dome of the gallbladder was grasped with prograsp and retracted over the dome of the liver. Adhesions between the gallbladder and omentum, duodenum and transverse colon were lysed via hook cautery. The infundibulum was grasped with the fenestrated grasper and retracted toward the right lower quadrant. This maneuver exposed Calot's triangle. Firefly was used throughout the dissection to ensure safe visualization of the cystic duct.The peritoneum overlying the gallbladder infundibulum was then dissected and the cystic duct and cystic artery identified.  Critical view of safety with the liver bed clearly visible behind the duct and artery with no additional structures noted.  The cystic duct and cystic artery were doubly clipped and divided close to the gallbladder.    The gallbladder was then dissected from its peritoneal and liver bed attachments by electrocautery. Hemostasis was checked prior to removing the hook cautery.  The [employer] was undocked and moved out of the field.  A 41mm Endo Catch bag was then placed through the umbilical port and the gallbladder was removed.  The gallbladder was passed off the table  as a specimen. There was no evidence of bleeding from the gallbladder fossa or cystic artery or leakage of the bile  from the cystic duct stump. The umbilical port site closed with a 0 vicryl with PMI needle.  The abdomen was desufflated and secondary trocars were removed under direct vision. No bleeding was noted. All skin incisions were closed with subcuticular sutures of 4-0 monocryl and dermabond.   Final inspection revealed acceptable hemostasis. All counts were correct at the end of the case. The patient was awakened from anesthesia and extubated without complication. The OG tube was removed.  The patient went to the PACU in stable condition.   Theophilus Kinds, DO [facility] [address] [place], [place] [address] [phone] (office)

## 2022-06-11 NOTE — Anesthesia Procedure Notes (Signed)
Procedure Name: Intubation Date/Time: 06/11/2022 9:36 AM  Performed by: Ronny Bacon, RNPre-anesthesia Checklist: Patient identified, Emergency Drugs available, Suction available and Patient being monitored Patient Re-evaluated:Patient Re-evaluated prior to induction Oxygen Delivery Method: Circle System Utilized Preoxygenation: Pre-oxygenation with 100% oxygen Induction Type: IV induction Ventilation: Mask ventilation without difficulty Laryngoscope Size: Mac and 3 Grade View: Grade II Tube type: Oral Tube size: 7.0 mm Number of attempts: 1 Airway Equipment and Method: Stylet and Oral airway Placement Confirmation: ETT inserted through vocal cords under direct vision, positive ETCO2 and breath sounds checked- equal and bilateral Secured at: 21 cm Tube secured with: Tape Dental Injury: Teeth and Oropharynx as per pre-operative assessment

## 2022-06-11 NOTE — Interval H&P Note (Signed)
History and Physical Interval Note:  06/11/2022 9:08 AM  Tiffany Ward  has presented today for surgery, with the diagnosis of Cholelithiasis.  The various methods of treatment have been discussed with the patient and family. After consideration of risks, benefits and other options for treatment, the patient has consented to  Procedure(s): XI ROBOTIC ASSISTED LAPAROSCOPIC CHOLECYSTECTOMY (N/A) as a surgical intervention.  The patient's history has been reviewed, patient examined, no change in status, stable for surgery.  I have reviewed the patient's chart and labs.  Questions were answered to the patient's satisfaction.     [name] A Schylar Wuebker

## 2022-06-11 NOTE — Progress Notes (Signed)
[facility]  Spoke with the patient's on the phone.  I explained that she tolerated the procedure without difficulty.  She has dissolvable stitches under the skin with overlying skin glue.  This will flake off in 10 to 14 days.  I discharged her home with a prescription for narcotic pain medication that they should take as needed for pain.  I also want her taking scheduled Tylenol.  If they take the narcotic pain medication, they should take a stool softener as well.  The patient will follow-up with me in 2 weeks for phone follow-up.  All questions were answered to her expressed satisfaction.  Theophilus Kinds, DO [facility] [address] [place], [place] [address] [phone] (office)

## 2022-06-11 NOTE — Transfer of Care (Signed)
Immediate Anesthesia Transfer of Care Note  Patient: Tiffany Ward  Procedure(s) Performed: XI ROBOTIC ASSISTED LAPAROSCOPIC CHOLECYSTECTOMY (Abdomen)  Patient Location: PACU  Anesthesia Type:General  Level of Consciousness: sedated, patient cooperative, and responds to stimulation  Airway & Oxygen Therapy: Patient Spontanous Breathing and Patient connected to face mask oxygen  Post-op Assessment: Report given to RN, Post -op Vital signs reviewed and stable, and Patient moving all extremities  Post vital signs: Reviewed and stable  Last Vitals:  Vitals Value Taken Time  BP    Temp    Pulse 87 06/11/22 1148  Resp    SpO2 100 % 06/11/22 1148  Vitals shown include unvalidated device data.  Last Pain:  Vitals:   06/11/22 0833  TempSrc: Oral  PainSc: 2          Complications: No notable events documented.

## 2022-06-11 NOTE — Discharge Instructions (Signed)
Ambulatory Surgery Discharge Instructions  General Anesthesia or Sedation Do not drive or operate heavy machinery for 24 hours.  Do not consume alcohol, tranquilizers, sleeping medications, or any non-prescribed medications for 24 hours. Do not make important decisions or sign any important papers in the next 24 hours. You should have someone with you tonight at home.  Activity  You are advised to go directly home from the hospital.  Restrict your activities and rest for a day.  Resume light activity tomorrow. No heavy lifting over 10 lbs or strenuous exercise.  Fluids and Diet Begin with clear liquids, bouillon, dry toast, soda crackers.  If not nauseated, you may go to a regular diet when you desire.  Greasy and spicy foods are not advised.  Medications  If you have not had a bowel movement in 24 hours, take 2 tablespoons over the counter Milk of mag.             You May resume your blood thinners tomorrow (Aspirin, coumadin, or other).  You are being discharged with prescriptions for Opioid/Narcotic Medications: There are some specific considerations for these medications that you should know. Opioid Meds have risks & benefits. Addiction to these meds is always a concern with prolonged use Take medication only as directed Do not drive while taking narcotic pain medication Do not crush tablets or capsules Do not use a different container than medication was dispensed in Lock the container of medication in a cool, dry place out of reach of children and pets. Opioid medication can cause addiction Do not share with anyone else (this is a felony) Do not store medications for future use. Dispose of them properly.     Disposal:  Find a [facility] household drug take back site near you.  If you can't get to a drug take back site, use the recipe below as a last resort to dispose of expired, unused or unwanted drugs. Disposal  (Do not dispose chemotherapy drugs this way, talk to your  prescribing doctor instead.) Step 1: Mix drugs (do not crush) with dirt, kitty litter, or used coffee grounds and add a small amount of water to dissolve any solid medications. Step 2: Seal drugs in plastic bag. Step 3: Place plastic bag in trash. Step 4: Take prescription container and scratch out personal information, then recycle or throw away.  Operative Site  You have a liquid bandage over your incisions, this will begin to flake off in about a week. Ok to shower tomorrow. Keep wound clean and dry. No baths or swimming. No lifting more than 10 pounds.  Contact Information: If you have questions or concerns, please call our office, [phone], Monday- Thursday 8AM-5PM and Friday 8AM-12Noon.  If it is after hours or on the weekend, please call [facility] Main [facility], [phone], and ask to speak to the surgeon on call for Dr. Artina Minella at [facility].   SPECIFIC COMPLICATIONS TO WATCH FOR: Inability to urinate Fever over 101? F by mouth Nausea and vomiting lasting longer than 24 hours. Pain not relieved by medication ordered Swelling around the operative site Increased redness, warmth, hardness, around operative area Numbness, tingling, or cold fingers or toes Blood -soaked dressing, (small amounts of oozing may be normal) Increasing and progressive drainage from surgical area or exam site  

## 2022-06-11 NOTE — Anesthesia Postprocedure Evaluation (Signed)
Anesthesia Post Note  Patient: Tiffany Ward  Procedure(s) Performed: XI ROBOTIC ASSISTED LAPAROSCOPIC CHOLECYSTECTOMY (Abdomen)  Patient location during evaluation: Phase II Anesthesia Type: General Level of consciousness: awake Pain management: pain level controlled Vital Signs Assessment: post-procedure vital signs reviewed and stable Respiratory status: spontaneous breathing and respiratory function stable Cardiovascular status: blood pressure returned to baseline and stable Postop Assessment: no headache and no apparent nausea or vomiting Anesthetic complications: no Comments: Late entry   No notable events documented.   Last Vitals:  Vitals:   06/11/22 1245 06/11/22 1302  BP: 122/74 (!) 153/88  Pulse: 88 86  Resp: (!) 26 20  Temp:  36.8 C  SpO2: 95% 95%    Last Pain:  Vitals:   06/11/22 1302  TempSrc:   PainSc: 5                  Windell Norfolk

## 2022-06-11 NOTE — Anesthesia Preprocedure Evaluation (Signed)
Anesthesia Evaluation  Patient identified by MRN, date of birth, ID band Patient awake    Reviewed: Allergy & Precautions, H&P , NPO status , Patient's Chart, lab work & pertinent test results, reviewed documented beta blocker date and time   History of Anesthesia Complications (+) PONV and history of anesthetic complications  Airway Mallampati: II  TM Distance: >3 FB Neck ROM: full    Dental no notable dental hx.    Pulmonary neg pulmonary ROS, sleep apnea    Pulmonary exam normal breath sounds clear to auscultation       Cardiovascular Exercise Tolerance: Good negative cardio ROS  Rhythm:regular Rate:Normal     Neuro/Psych negative neurological ROS  negative psych ROS   GI/Hepatic negative GI ROS, Neg liver ROS,,,  Endo/Other  diabetes, Type 2  Morbid obesity  Renal/GU negative Renal ROS  negative genitourinary   Musculoskeletal   Abdominal   Peds  Hematology negative hematology ROS (+)   Anesthesia Other Findings   Reproductive/Obstetrics negative OB ROS                             Anesthesia Physical Anesthesia Plan  ASA: 3  Anesthesia Plan: General and General ETT   Post-op Pain Management:    Induction:   PONV Risk Score and Plan: Ondansetron and Scopolamine patch - Pre-op  Airway Management Planned:   Additional Equipment:   Intra-op Plan:   Post-operative Plan:   Informed Consent: I have reviewed the patients History and Physical, chart, labs and discussed the procedure including the risks, benefits and alternatives for the proposed anesthesia with the patient or authorized representative who has indicated his/her understanding and acceptance.     Dental Advisory Given  Plan Discussed with: CRNA  Anesthesia Plan Comments:        Anesthesia Quick Evaluation

## 2022-06-15 ENCOUNTER — Encounter: Payer: Self-pay | Admitting: *Deleted

## 2022-06-16 LAB — SURGICAL PATHOLOGY

## 2022-07-21 ENCOUNTER — Ambulatory Visit (INDEPENDENT_AMBULATORY_CARE_PROVIDER_SITE_OTHER): Payer: Self-pay | Admitting: Surgery

## 2022-07-21 DIAGNOSIS — Z09 Encounter for follow-up examination after completed treatment for conditions other than malignant neoplasm: Secondary | ICD-10-CM

## 2022-07-21 NOTE — Progress Notes (Signed)
[facility]  I am calling the patient for post operative evaluation. This is not a billable encounter as it is under the global charges for the surgery.  The patient had a robotic assisted laparoscopic cholecystectomy on 4/12. The patient reports that she is doing well. She is tolerating a diet, having good pain control, and having regular Bms.  The incisions are healing well. The patient has no concerns.   Pathology: A. GALLBLADDER, CHOLECYSTECTOMY:  - Chronic cholecystitis with cholelithiasis   Will see the patient PRN.   Theophilus Kinds, DO [facility] [address] [place], [place] [address] [phone] (office)

## 2022-12-10 ENCOUNTER — Encounter: Payer: Self-pay | Admitting: Gastroenterology

## 2023-10-19 ENCOUNTER — Emergency Department (HOSPITAL_COMMUNITY)
Admission: EM | Admit: 2023-10-19 | Discharge: 2023-10-19 | Disposition: A | Discharge status: Home / Self Care | Payer: Self-pay | Attending: Emergency Medicine | Admitting: Emergency Medicine

## 2023-10-19 ENCOUNTER — Encounter (HOSPITAL_COMMUNITY): Payer: Self-pay | Admitting: Emergency Medicine

## 2023-10-19 ENCOUNTER — Other Ambulatory Visit: Payer: Self-pay

## 2023-10-19 DIAGNOSIS — L02214 Cutaneous abscess of groin: Secondary | ICD-10-CM | POA: Insufficient documentation

## 2023-10-19 DIAGNOSIS — Z5321 Procedure and treatment not carried out due to patient leaving prior to being seen by health care provider: Secondary | ICD-10-CM | POA: Insufficient documentation

## 2023-10-19 NOTE — ED Triage Notes (Signed)
 Pt reports boil to groin area on the right side.  Reports it has burst and drained but feels it may be infected

## 2023-11-23 ENCOUNTER — Encounter (HOSPITAL_COMMUNITY): Payer: Self-pay

## 2023-11-23 ENCOUNTER — Emergency Department (HOSPITAL_COMMUNITY)
Admission: EM | Admit: 2023-11-23 | Discharge: 2023-11-23 | Disposition: A | Discharge status: Home / Self Care | Payer: Self-pay | Attending: Emergency Medicine | Admitting: Emergency Medicine

## 2023-11-23 ENCOUNTER — Emergency Department (HOSPITAL_COMMUNITY): Payer: Self-pay

## 2023-11-23 ENCOUNTER — Other Ambulatory Visit: Payer: Self-pay

## 2023-11-23 DIAGNOSIS — M25512 Pain in left shoulder: Secondary | ICD-10-CM | POA: Insufficient documentation

## 2023-11-23 DIAGNOSIS — F19939 Other psychoactive substance use, unspecified with withdrawal, unspecified: Secondary | ICD-10-CM | POA: Insufficient documentation

## 2023-11-23 DIAGNOSIS — H9201 Otalgia, right ear: Secondary | ICD-10-CM | POA: Insufficient documentation

## 2023-11-23 DIAGNOSIS — R519 Headache, unspecified: Secondary | ICD-10-CM | POA: Insufficient documentation

## 2023-11-23 DIAGNOSIS — N6001 Solitary cyst of right breast: Secondary | ICD-10-CM | POA: Insufficient documentation

## 2023-11-23 LAB — CBC WITH DIFFERENTIAL/PLATELET
Abs Immature Granulocytes: 0.07 K/uL (ref 0.00–0.07)
Basophils Absolute: 0.1 K/uL (ref 0.0–0.1)
Basophils Relative: 1 %
Eosinophils Absolute: 0.1 K/uL (ref 0.0–0.5)
Eosinophils Relative: 1 %
HCT: 45.6 % (ref 36.0–46.0)
Hemoglobin: 15.4 g/dL — ABNORMAL HIGH (ref 12.0–15.0)
Immature Granulocytes: 1 %
Lymphocytes Relative: 28 %
Lymphs Abs: 2.9 K/uL (ref 0.7–4.0)
MCH: 30.6 pg (ref 26.0–34.0)
MCHC: 33.8 g/dL (ref 30.0–36.0)
MCV: 90.5 fL (ref 80.0–100.0)
Monocytes Absolute: 0.6 K/uL (ref 0.1–1.0)
Monocytes Relative: 6 %
Neutro Abs: 6.7 K/uL (ref 1.7–7.7)
Neutrophils Relative %: 63 %
Platelets: 259 K/uL (ref 150–400)
RBC: 5.04 MIL/uL (ref 3.87–5.11)
RDW: 13.5 % (ref 11.5–15.5)
WBC: 10.3 K/uL (ref 4.0–10.5)
nRBC: 0 % (ref 0.0–0.2)

## 2023-11-23 LAB — COMPREHENSIVE METABOLIC PANEL WITH GFR
ALT: 23 U/L (ref 0–44)
AST: 23 U/L (ref 15–41)
Albumin: 3.8 g/dL (ref 3.5–5.0)
Alkaline Phosphatase: 42 U/L (ref 38–126)
Anion gap: 12 (ref 5–15)
BUN: 14 mg/dL (ref 6–20)
CO2: 23 mmol/L (ref 22–32)
Calcium: 8.7 mg/dL — ABNORMAL LOW (ref 8.9–10.3)
Chloride: 102 mmol/L (ref 98–111)
Creatinine, Ser: 0.68 mg/dL (ref 0.44–1.00)
GFR, Estimated: >60 mL/min (ref >60)
Glucose, Bld: 142 mg/dL — ABNORMAL HIGH (ref 70–99)
Potassium: 3.7 mmol/L (ref 3.5–5.1)
Sodium: 137 mmol/L (ref 135–145)
Total Bilirubin: 0.8 mg/dL (ref 0.0–1.2)
Total Protein: 7.2 g/dL (ref 6.5–8.1)

## 2023-11-23 LAB — URINALYSIS, ROUTINE W REFLEX MICROSCOPIC
Bacteria, UA: NONE SEEN
Bilirubin Urine: NEGATIVE
Glucose, UA: NEGATIVE mg/dL
Ketones, ur: NEGATIVE mg/dL
Leukocytes,Ua: NEGATIVE
Nitrite: NEGATIVE
Protein, ur: NEGATIVE mg/dL
Specific Gravity, Urine: 1.026 (ref 1.005–1.030)
pH: 6 (ref 5.0–8.0)

## 2023-11-23 LAB — TROPONIN I (HIGH SENSITIVITY): Troponin I (High Sensitivity): 3 ng/L (ref <18)

## 2023-11-23 LAB — MAGNESIUM: Magnesium: 2 mg/dL (ref 1.7–2.4)

## 2023-11-23 LAB — HCG, SERUM, QUALITATIVE: Preg, Serum: NEGATIVE

## 2023-11-23 MED ORDER — VENLAFAXINE HCL ER 150 MG PO CP24: 300.0000 mg | ORAL_CAPSULE | Freq: Every day | ORAL | 0 refills | Status: DC

## 2023-11-23 MED ORDER — VENLAFAXINE HCL ER 150 MG PO CP24: 150.0000 mg | ORAL_CAPSULE | Freq: Every day | ORAL | 0 refills | Status: AC

## 2023-11-23 MED ORDER — IOHEXOL 350 MG/ML SOLN
75.0000 mL | Freq: Once | INTRAVENOUS | Status: AC | PRN
Start: 2023-11-23 — End: 2023-11-23
  Administered 2023-11-23: 75 mL via INTRAVENOUS

## 2023-11-23 MED ORDER — SODIUM CHLORIDE 0.9 % IV BOLUS: 1000.0000 mL | Freq: Once | INTRAVENOUS | Status: DC

## 2023-11-23 MED ORDER — DEXAMETHASONE SODIUM PHOSPHATE 10 MG/ML IJ SOLN
10.0000 mg | Freq: Once | INTRAMUSCULAR | Status: AC
  Administered 2023-11-23: 10 mg via INTRAVENOUS
  Filled 2023-11-23: qty 1

## 2023-11-23 MED ORDER — SODIUM CHLORIDE 0.9 % IV BOLUS
1000.0000 mL | Freq: Once | INTRAVENOUS | Status: AC
  Administered 2023-11-23: 1000 mL via INTRAVENOUS

## 2023-11-23 MED ORDER — PROCHLORPERAZINE EDISYLATE 10 MG/2ML IJ SOLN
10.0000 mg | Freq: Once | INTRAMUSCULAR | Status: AC
  Administered 2023-11-23: 10 mg via INTRAVENOUS
  Filled 2023-11-23: qty 2

## 2023-11-23 MED ORDER — VENLAFAXINE HCL ER 37.5 MG PO CP24
150.0000 mg | ORAL_CAPSULE | Freq: Once | ORAL | Status: AC
  Administered 2023-11-23: 150 mg via ORAL
  Filled 2023-11-23: qty 4

## 2023-11-23 MED ORDER — DIPHENHYDRAMINE HCL 50 MG/ML IJ SOLN
25.0000 mg | Freq: Once | INTRAMUSCULAR | Status: AC
  Administered 2023-11-23: 25 mg via INTRAVENOUS
  Filled 2023-11-23: qty 1

## 2023-11-23 NOTE — ED Notes (Signed)
 Provider notified of pt having possible tremors from withdrawal.

## 2023-11-23 NOTE — ED Triage Notes (Signed)
 Pt arrived via POV with multiple complaints. Pt first stated she had elevated BP reading yesterday, but has not rechecked her BP today. Pt also endorses headache, withdrawals from her depression medication, swelling on right side of her face, ear pain, a drumming sensation in her left ear, increased stress this past week, and reports she cannot get an upcoming appointment with her PCP.

## 2023-11-23 NOTE — ED Provider Notes (Signed)
 [place] EMERGENCY DEPARTMENT AT [facility] Provider Note   CSN: [id number] Arrival date & time: 11/23/23  9046     Patient presents with: multiple complaints   Tiffany Ward is a 38 y.o. female.   Patient is a 38 year old female who presents to the emergency department with a chief complaint of headache, right-sided ear pain, left shoulder pain and elevated blood pressure.  Patient notes that symptoms have been ongoing for approximate the past 3 days.  Patient notes that she has been out of her Effexor  for approximate the past 3 days and is unsure if she may be withdrawing from this medication.  She is also undergoing treatment for an abscess and staph infection with Bactrim at this point to her lower abdomen.  She denies any abdominal pain, nausea, vomiting, diarrhea.  She denies any direct chest pain or shortness of breath.  She has had no dizziness, lightheadedness or syncope.  She does admit to associated tremors since stopping her Effexor .  She denies any recent falls or blunt head trauma.  She has had no pain to her neck or back.        Prior to Admission medications   Medication Sig Start Date End Date Taking? Authorizing Provider  sulfamethoxazole-trimethoprim (BACTRIM DS) 800-160 MG tablet Take by mouth. 11/19/23 11/26/23 Yes [provider]  venlafaxine  XR (EFFEXOR -XR) 150 MG 24 hr capsule Take 500 mg by mouth daily with breakfast.   Yes [provider]  atorvastatin (LIPITOR) 40 MG tablet Take 40 mg by mouth every evening.    [provider]  ergocalciferol (VITAMIN D2) 1.25 MG (50000 UT) capsule Take 50,000 Units by mouth every Tuesday.    [provider]  etonogestrel (NEXPLANON) 68 MG IMPL implant 68 mg by Subdermal route once.    [provider]  metFORMIN (GLUCOPHAGE-XR) 500 MG 24 hr tablet Take 500 mg by mouth 2 (two) times daily. 07/04/19   [provider]  montelukast (SINGULAIR) 10 MG tablet Take 10 mg  by mouth at bedtime.  07/03/19   [provider]  ondansetron  (ZOFRAN -ODT) 4 MG disintegrating tablet Take 1 tablet (4 mg total) by mouth every 8 (eight) hours as needed for nausea or vomiting. Patient not taking: Reported on 06/04/2022 05/12/22   Yolande Lamar BROCKS, MD  oxyCODONE  (ROXICODONE ) 5 MG immediate release tablet Take 1 tablet (5 mg total) by mouth every 4 (four) hours as needed for severe pain. 06/11/22   Pappayliou, Dorothyann A, DO    Allergies: Codeine    Review of Systems  HENT:  Positive for ear pain.   Neurological:  Positive for tremors and headaches.  All other systems reviewed and are negative.   Updated Vital Signs BP (!) 166/94   Pulse 74   Temp 98.3 F (36.8 C) (Oral)   Resp 20   Ht 5' 9 (1.753 m)   Wt 130.6 kg   SpO2 97%   BMI 42.52 kg/m   Physical Exam Vitals and nursing note reviewed.  Constitutional:      General: She is not in acute distress.    Appearance: Normal appearance. She is not ill-appearing.  HENT:     Head: Normocephalic and atraumatic.     Right Ear: Tympanic membrane, ear canal and external ear normal.     Left Ear: Tympanic membrane, ear canal and external ear normal.     Ears:     Comments: No mastoid tenderness bilaterally    Nose: Nose normal.  Mouth/Throat:     Mouth: Mucous membranes are moist.  Eyes:     Extraocular Movements: Extraocular movements intact.     Conjunctiva/sclera: Conjunctivae normal.     Pupils: Pupils are equal, round, and reactive to light.  Cardiovascular:     Rate and Rhythm: Normal rate and regular rhythm.     Pulses: Normal pulses.     Heart sounds: Normal heart sounds. No murmur heard.    No gallop.  Pulmonary:     Effort: Pulmonary effort is normal. No respiratory distress.     Breath sounds: Normal breath sounds. No stridor. No wheezing, rhonchi or rales.  Abdominal:     General: Abdomen is flat. Bowel sounds are normal. There is no distension.     Palpations: Abdomen is soft.      Tenderness: There is no abdominal tenderness. There is no guarding.  Musculoskeletal:        General: No swelling, tenderness, deformity or signs of injury. Normal range of motion.     Cervical back: Normal range of motion and neck supple. No rigidity or tenderness.     Right lower leg: No edema.     Left lower leg: No edema.  Skin:    General: Skin is warm and dry.     Findings: No bruising or rash.  Neurological:     General: No focal deficit present.     Mental Status: She is alert and oriented to person, place, and time. Mental status is at baseline.     Cranial Nerves: No cranial nerve deficit.     Sensory: No sensory deficit.     Motor: No weakness.     Coordination: Coordination normal.     Gait: Gait normal.  Psychiatric:        Mood and Affect: Mood normal.        Behavior: Behavior normal.        Thought Content: Thought content normal.        Judgment: Judgment normal.     (all labs ordered are listed, but only abnormal results are displayed) Labs Reviewed  CBC WITH DIFFERENTIAL/PLATELET - Abnormal; Notable for the following components:      Result Value   Hemoglobin 15.4 (*)    All other components within normal limits  COMPREHENSIVE METABOLIC PANEL WITH GFR - Abnormal; Notable for the following components:   Glucose, Bld 142 (*)    Calcium 8.7 (*)    All other components within normal limits  HCG, SERUM, QUALITATIVE  MAGNESIUM  URINALYSIS, ROUTINE W REFLEX MICROSCOPIC  TROPONIN I (HIGH SENSITIVITY)    EKG: None  Radiology: No results found.   Procedures   Medications Ordered in the ED  prochlorperazine  (COMPAZINE ) injection 10 mg (10 mg Intravenous Given 11/23/23 1338)  diphenhydrAMINE  (BENADRYL ) injection 25 mg (25 mg Intravenous Given 11/23/23 1339)  dexamethasone  (DECADRON ) injection 10 mg (10 mg Intravenous Given 11/23/23 1338)  sodium chloride  0.9 % bolus 1,000 mL (1,000 mLs Intravenous New Bag/Given 11/23/23 1343)  venlafaxine  XR (EFFEXOR -XR) 24  hr capsule 150 mg (150 mg Oral Given 11/23/23 1336)  iohexol  (OMNIPAQUE ) 350 MG/ML injection 75 mL (75 mLs Intravenous Contrast Given 11/23/23 1355)                                    Medical Decision Making Patient is doing very well at this time and is stable for discharge home.  Patient  is already aware of the right sided breast cyst and notes that she will continue to follow-up with her primary care doctor regarding this.  Patient notes her symptoms have completely resolved with treatment in the emergency department.  Do suspect that symptoms are secondary to withdrawal from her Effexor .  She will continue to follow-up with her primary care doctor for continuation of this and will call and some of the Effexor  to help with her symptoms until her primary care doctor calls in her final prescription.  Patient has no concern neurological deficits at this time I do not suspect any further emergent workup is warranted.  Blood work is otherwise been unremarkable at this time.  Do not suspect ACS at this point.  EKG has no acute ischemic changes and she has negative troponin.  Strict return precautions were provided for any new or worsening symptoms.  Patient voiced understanding and had no additional questions.  Amount and/or Complexity of Data Reviewed Labs: ordered. Radiology: ordered.  Risk Prescription drug management.        Final diagnoses:  None    ED Discharge Orders     None          Tiffany Ward 11/23/23 1537    Towana Ozell BROCKS, MD 11/23/23 1719

## 2023-11-23 NOTE — Discharge Instructions (Signed)
 Please continue to take the Effexor  as directed.  Follow-up closely with your primary care doctor on an outpatient basis.  Please continue to ensure that your primary care doctor monitors the right sided breast cyst.  Return to emergency department immediately for any new or worsening symptoms.
